# Patient Record
Sex: Female | Born: 1955 | Race: White | Hispanic: No | Marital: Single | Smoking: Current every day smoker
Health system: Southern US, Community
[De-identification: ages and names within clinical notes are randomized; demographics above are authoritative.]

## PROBLEM LIST (undated history)

## (undated) DIAGNOSIS — M549 Dorsalgia, unspecified: Secondary | ICD-10-CM

## (undated) DIAGNOSIS — E162 Hypoglycemia, unspecified: Secondary | ICD-10-CM

## (undated) DIAGNOSIS — R51 Headache: Secondary | ICD-10-CM

## (undated) DIAGNOSIS — M797 Fibromyalgia: Secondary | ICD-10-CM

## (undated) DIAGNOSIS — E7800 Pure hypercholesterolemia, unspecified: Secondary | ICD-10-CM

## (undated) DIAGNOSIS — H269 Unspecified cataract: Secondary | ICD-10-CM

## (undated) DIAGNOSIS — G8929 Other chronic pain: Secondary | ICD-10-CM

## (undated) HISTORY — DX: Unspecified cataract: H26.9

## (undated) HISTORY — PX: ADENOIDECTOMY: SUR15

## (undated) HISTORY — PX: FOOT SURGERY: SHX648

## (undated) HISTORY — PX: TONSILLECTOMY: SUR1361

---

## 2011-08-14 ENCOUNTER — Emergency Department (HOSPITAL_BASED_OUTPATIENT_CLINIC_OR_DEPARTMENT_OTHER)
Admission: EM | Admit: 2011-08-14 | Discharge: 2011-08-15 | Disposition: A | Discharge status: Home / Self Care | Payer: Managed Care, Other (non HMO) | Attending: Emergency Medicine | Admitting: Emergency Medicine

## 2011-08-14 ENCOUNTER — Encounter: Payer: Self-pay | Admitting: *Deleted

## 2011-08-14 DIAGNOSIS — R110 Nausea: Secondary | ICD-10-CM | POA: Insufficient documentation

## 2011-08-14 DIAGNOSIS — E7800 Pure hypercholesterolemia, unspecified: Secondary | ICD-10-CM | POA: Insufficient documentation

## 2011-08-14 DIAGNOSIS — Z79899 Other long term (current) drug therapy: Secondary | ICD-10-CM | POA: Insufficient documentation

## 2011-08-14 DIAGNOSIS — R197 Diarrhea, unspecified: Secondary | ICD-10-CM | POA: Insufficient documentation

## 2011-08-14 DIAGNOSIS — IMO0001 Reserved for inherently not codable concepts without codable children: Secondary | ICD-10-CM | POA: Insufficient documentation

## 2011-08-14 DIAGNOSIS — R51 Headache: Secondary | ICD-10-CM | POA: Insufficient documentation

## 2011-08-14 HISTORY — DX: Hypoglycemia, unspecified: E16.2

## 2011-08-14 HISTORY — DX: Pure hypercholesterolemia, unspecified: E78.00

## 2011-08-14 HISTORY — DX: Headache: R51

## 2011-08-14 HISTORY — DX: Fibromyalgia: M79.7

## 2011-08-14 MED ORDER — KETOROLAC TROMETHAMINE 30 MG/ML IJ SOLN
30.0000 mg | Freq: Once | INTRAMUSCULAR | Status: AC
  Administered 2011-08-14: 30 mg via INTRAVENOUS
  Filled 2011-08-14: qty 1

## 2011-08-14 MED ORDER — PROMETHAZINE HCL 25 MG/ML IJ SOLN
25.0000 mg | Freq: Once | INTRAMUSCULAR | Status: AC
  Administered 2011-08-14: 25 mg via INTRAVENOUS
  Filled 2011-08-14: qty 1

## 2011-08-14 MED ORDER — SODIUM CHLORIDE 0.9 % IV BOLUS (SEPSIS)
500.0000 mL | Freq: Once | INTRAVENOUS | Status: AC
  Administered 2011-08-14: 1000 mL via INTRAVENOUS

## 2011-08-14 NOTE — ED Notes (Signed)
Pt to room 6 by ems via stretcher. Pt reports her usual migraine sx onset 3pm today, with photophobia, n/v. No relief with her home meds, pt states she is also having "a flare up" of her fibromyalgia, so she had to take "a few of my pain pills today". Pt states her pain is 10/10, then drifts off to sleep while this rn is triaging her. Husband is at bedside.

## 2011-08-14 NOTE — ED Provider Notes (Signed)
History     CSN: [id number]  Arrival date & time 08/14/11  Debbie Mclaughlin   First MD Initiated Contact with Patient 08/14/11 2150      Chief Complaint  Patient presents with  . Headache    (Consider location/radiation/quality/duration/timing/severity/associated sxs/prior treatment) Patient is a 55 y.o. female presenting with headaches. The history is provided by the patient.  Headache  This is a recurrent problem. The current episode started yesterday. The problem has not changed since onset.Associated symptoms include nausea. Pertinent negatives include no fever and no vomiting.   patient states she has a headache that began today. States she woke with the symptoms. She has a history of migraines and fibromyalgia. She states normally the pain will go way with muscle relaxers and pain medicines. She states this time has not. She states she's had nauseousness without vomiting. No fevers. She states her fibromyalgia is acting up too. She states she's been fatigued also. She states this is the most severe headache she has had, but it is the same type headache she normally has. She states that she's been drinking a lot of water for the last few days to flush out her system. She states she's been more fatigued than normal.  Past Medical History  Diagnosis Date  . Headache   . Hypoglycemia   . Fibromyalgia   . High cholesterol     History reviewed. No pertinent past surgical history.  No family history on file.  History  Substance Use Topics  . Smoking status: Not on file  . Smokeless tobacco: Not on file  . Alcohol Use: Yes     occasionally    OB History    Grav Para Term Preterm Abortions TAB SAB Ect Mult Living                  Review of Systems  Constitutional: Positive for fatigue. Negative for fever and chills.  HENT: Negative for ear pain.   Eyes: Positive for photophobia. Negative for pain.  Respiratory: Negative for cough.   Gastrointestinal: Positive for nausea and  diarrhea. Negative for vomiting.  Genitourinary: Negative for flank pain.  Skin: Negative for pallor and rash.  Neurological: Positive for headaches. Negative for seizures.    Allergies  Benzoyl peroxide  Home Medications   Current Outpatient Rx  Name Route Sig Dispense Refill  . CALCIUM 1200 PO Oral Take 1 tablet by mouth daily.      Marland Kitchen VITAMIN D 1000 UNITS PO TABS Oral Take 1,000 Units by mouth daily.      . DULOXETINE HCL 60 MG PO CPEP Oral Take 60 mg by mouth daily.      Marland Kitchen HYDROCODONE-ACETAMINOPHEN 7.5-325 MG PO TABS Oral Take 1-2 tablets by mouth every 6 (six) hours as needed. For pain      . MAGNESIUM PO Oral Take 1 tablet by mouth daily.      Marland Kitchen MINOCYCLINE HCL 50 MG PO CAPS Oral Take 50 mg by mouth every 12 (twelve) hours.      Marland Kitchen PITAVASTATIN CALCIUM 2 MG PO TABS Oral Take 1 tablet by mouth daily.      Marland Kitchen TIZANIDINE HCL 4 MG PO TABS Oral Take 2-4 mg by mouth 3 (three) times daily as needed. For muscle spasms        BP 125/72  Pulse 56  Temp(Src) 97.4 F (36.3 C) (Oral)  Resp 18  Ht 5\' 7"  (1.702 m)  Wt 245 lb (111.131 kg)  BMI 38.37 kg/m2  SpO2  100%  Physical Exam  Nursing note and vitals reviewed. Constitutional: She is oriented to person, place, and time. She appears well-developed and well-nourished.  HENT:  Head: Normocephalic and atraumatic.  Eyes: EOM are normal. Pupils are equal, round, and reactive to light.  Neck: Normal range of motion. Neck supple.  Cardiovascular: Regular rhythm and normal heart sounds.   No murmur heard.      Mild bradycardia  Pulmonary/Chest: Effort normal and breath sounds normal. No respiratory distress. She has no wheezes. She has no rales.  Abdominal: Soft. Bowel sounds are normal. She exhibits no distension. There is no tenderness. There is no rebound and no guarding.  Musculoskeletal: Normal range of motion.  Lymphadenopathy:    She has no cervical adenopathy.  Neurological: She is alert and oriented to person, place, and  time. No cranial nerve deficit.  Skin: Skin is warm and dry.  Psychiatric: She has a normal mood and affect. Her speech is normal.    ED Course  Procedures (including critical care time)  Labs Reviewed  BASIC METABOLIC PANEL - Abnormal; Notable for the following:    Glucose, Bld 102 (*)    GFR calc non Af Amer 63 (*)    GFR calc Af Amer 73 (*)    All other components within normal limits   No results found.   1. Headache       MDM  Patient has headache. Also some nausea vomiting diarrhea. Pain is like her typical headaches, but more severe. No fevers. Patient feels better after treatment with a migraine cocktail. She'll be discharged home with her husband.  Juliet Rude. Rubin Payor, MD 08/15/11 [id number]

## 2011-08-15 LAB — BASIC METABOLIC PANEL
Calcium: 10.1 mg/dL (ref 8.4–10.5)
Creatinine, Ser: 1 mg/dL (ref 0.50–1.10)
GFR calc Af Amer: 73 mL/min — ABNORMAL LOW (ref >90)
Sodium: 140 mEq/L (ref 135–145)

## 2013-01-17 ENCOUNTER — Emergency Department (HOSPITAL_BASED_OUTPATIENT_CLINIC_OR_DEPARTMENT_OTHER)
Admission: EM | Admit: 2013-01-17 | Discharge: 2013-01-17 | Disposition: A | Discharge status: Home / Self Care | Payer: BC Managed Care – PPO | Attending: Emergency Medicine | Admitting: Emergency Medicine

## 2013-01-17 ENCOUNTER — Encounter (HOSPITAL_BASED_OUTPATIENT_CLINIC_OR_DEPARTMENT_OTHER): Payer: Self-pay

## 2013-01-17 DIAGNOSIS — G43909 Migraine, unspecified, not intractable, without status migrainosus: Secondary | ICD-10-CM | POA: Insufficient documentation

## 2013-01-17 DIAGNOSIS — M549 Dorsalgia, unspecified: Secondary | ICD-10-CM | POA: Insufficient documentation

## 2013-01-17 DIAGNOSIS — Z862 Personal history of diseases of the blood and blood-forming organs and certain disorders involving the immune mechanism: Secondary | ICD-10-CM | POA: Insufficient documentation

## 2013-01-17 DIAGNOSIS — Z8639 Personal history of other endocrine, nutritional and metabolic disease: Secondary | ICD-10-CM | POA: Insufficient documentation

## 2013-01-17 DIAGNOSIS — IMO0001 Reserved for inherently not codable concepts without codable children: Secondary | ICD-10-CM | POA: Insufficient documentation

## 2013-01-17 DIAGNOSIS — G8929 Other chronic pain: Secondary | ICD-10-CM | POA: Insufficient documentation

## 2013-01-17 DIAGNOSIS — R110 Nausea: Secondary | ICD-10-CM | POA: Insufficient documentation

## 2013-01-17 DIAGNOSIS — H53149 Visual discomfort, unspecified: Secondary | ICD-10-CM | POA: Insufficient documentation

## 2013-01-17 DIAGNOSIS — R5381 Other malaise: Secondary | ICD-10-CM | POA: Insufficient documentation

## 2013-01-17 DIAGNOSIS — F17200 Nicotine dependence, unspecified, uncomplicated: Secondary | ICD-10-CM | POA: Insufficient documentation

## 2013-01-17 HISTORY — DX: Dorsalgia, unspecified: M54.9

## 2013-01-17 HISTORY — DX: Other chronic pain: G89.29

## 2013-01-17 MED ORDER — KETOROLAC TROMETHAMINE 30 MG/ML IJ SOLN
30.0000 mg | Freq: Once | INTRAMUSCULAR | Status: AC
  Administered 2013-01-17: 30 mg via INTRAVENOUS
  Filled 2013-01-17: qty 1

## 2013-01-17 MED ORDER — METOCLOPRAMIDE HCL 5 MG/ML IJ SOLN
10.0000 mg | Freq: Once | INTRAMUSCULAR | Status: AC
  Administered 2013-01-17: 10 mg via INTRAVENOUS
  Filled 2013-01-17: qty 2

## 2013-01-17 MED ORDER — SODIUM CHLORIDE 0.9 % IV BOLUS (SEPSIS)
1000.0000 mL | Freq: Once | INTRAVENOUS | Status: AC
  Administered 2013-01-17: 1000 mL via INTRAVENOUS

## 2013-01-17 NOTE — ED Provider Notes (Signed)
History     CSN: [id number]  Arrival date & time 01/17/13  5188   First MD Initiated Contact with Patient 01/17/13 [phone]      Chief Complaint  Patient presents with  . Migraine  . Back Pain    (Consider location/radiation/quality/duration/timing/severity/associated sxs/prior treatment) Patient is a 57 y.o. female presenting with migraines and back pain.  Migraine  Back Pain  Pt with history of migraines and fibromyalgia reports she had diffuse throbbing frontal headache onset during the night, associated with nausea, photophobia and generalized weakness as well as back pain. She took an excedrin migraine this AM but apparently began having cold sweats and so a friend called EMS. She actually is feeling better from a headache stand point now. Pain was 10/10 but now 3/10, however she still has photophobia and general weakness. This is similar to previous headaches.   Past Medical History  Diagnosis Date  . Headache(784.0)   . Hypoglycemia   . Fibromyalgia   . High cholesterol   . Chronic back pain     Past Surgical History  Procedure Laterality Date  . Cesarean section    . Foot surgery    . Tonsillectomy    . Adenoidectomy      No family history on file.  History  Substance Use Topics  . Smoking status: Current Every Day Smoker -- 1.00 packs/day    Types: Cigarettes  . Smokeless tobacco: Not on file  . Alcohol Use: Yes     Comment: rarely    OB History   Grav Para Term Preterm Abortions TAB SAB Ect Mult Living                  Review of Systems  Musculoskeletal: Positive for back pain.   All other systems reviewed and are negative except as noted in HPI.   Allergies  Benzoyl peroxide  Home Medications   Current Outpatient Rx  Name  Route  Sig  Dispense  Refill  . traMADol (ULTRAM) 50 MG tablet   Oral   Take 50 mg by mouth every 6 (six) hours as needed for pain.           BP 120/76  Pulse 57  Temp(Src) 97.5 F (36.4 C) (Oral)  Resp 18  Ht  5\' 6"  (1.676 m)  Wt 229 lb (103.874 kg)  BMI 36.98 kg/m2  SpO2 97%  Physical Exam  Nursing note and vitals reviewed. Constitutional: She is oriented to person, place, and time. She appears well-developed and well-nourished.  HENT:  Head: Normocephalic and atraumatic.  Eyes: EOM are normal. Pupils are equal, round, and reactive to light.  Neck: Normal range of motion. Neck supple.  Cardiovascular: Normal rate, normal heart sounds and intact distal pulses.   Pulmonary/Chest: Effort normal and breath sounds normal.  Abdominal: Bowel sounds are normal. She exhibits no distension. There is no tenderness.  Musculoskeletal: Normal range of motion. She exhibits no edema and no tenderness.  Neurological: She is alert and oriented to person, place, and time. She has normal strength. No cranial nerve deficit or sensory deficit. Coordination normal.  Skin: Skin is warm and dry. No rash noted.  Psychiatric: She has a normal mood and affect.    ED Course  Procedures (including critical care time)  Labs Reviewed - No data to display No results found.   1. Migraine       MDM  Pt feeling much better after Toradol/Reglan and IVF. She is sitting up smiling and  ready to go home.         Blong Busk B. Bernette Mayers, MD 01/17/13 [phone]

## 2013-01-17 NOTE — ED Notes (Signed)
Pt reports onset of migraine this am and increased back pain unrelieved after taking Excedrin Migraine.

## 2014-11-22 ENCOUNTER — Encounter (HOSPITAL_BASED_OUTPATIENT_CLINIC_OR_DEPARTMENT_OTHER): Payer: Self-pay | Admitting: Emergency Medicine

## 2014-11-22 ENCOUNTER — Emergency Department (HOSPITAL_BASED_OUTPATIENT_CLINIC_OR_DEPARTMENT_OTHER)
Admission: EM | Admit: 2014-11-22 | Discharge: 2014-11-22 | Disposition: A | Discharge status: Home / Self Care | Payer: Medicare Other | Attending: Emergency Medicine | Admitting: Emergency Medicine

## 2014-11-22 DIAGNOSIS — R51 Headache: Secondary | ICD-10-CM | POA: Insufficient documentation

## 2014-11-22 DIAGNOSIS — G8929 Other chronic pain: Secondary | ICD-10-CM | POA: Diagnosis not present

## 2014-11-22 DIAGNOSIS — R519 Headache, unspecified: Secondary | ICD-10-CM

## 2014-11-22 DIAGNOSIS — Z720 Tobacco use: Secondary | ICD-10-CM | POA: Diagnosis not present

## 2014-11-22 DIAGNOSIS — Z79899 Other long term (current) drug therapy: Secondary | ICD-10-CM | POA: Diagnosis not present

## 2014-11-22 DIAGNOSIS — Z8639 Personal history of other endocrine, nutritional and metabolic disease: Secondary | ICD-10-CM | POA: Diagnosis not present

## 2014-11-22 DIAGNOSIS — Z91040 Latex allergy status: Secondary | ICD-10-CM | POA: Diagnosis not present

## 2014-11-22 DIAGNOSIS — Z8739 Personal history of other diseases of the musculoskeletal system and connective tissue: Secondary | ICD-10-CM | POA: Insufficient documentation

## 2014-11-22 LAB — URINALYSIS, ROUTINE W REFLEX MICROSCOPIC
BILIRUBIN URINE: NEGATIVE
Glucose, UA: NEGATIVE mg/dL
Ketones, ur: NEGATIVE mg/dL
Leukocytes, UA: NEGATIVE
Nitrite: NEGATIVE
Protein, ur: NEGATIVE mg/dL
SPECIFIC GRAVITY, URINE: 1.02 (ref 1.005–1.030)
UROBILINOGEN UA: 0.2 mg/dL (ref 0.0–1.0)
pH: 6.5 (ref 5.0–8.0)

## 2014-11-22 LAB — URINE MICROSCOPIC-ADD ON

## 2014-11-22 LAB — COMPREHENSIVE METABOLIC PANEL
ALBUMIN: 3.8 g/dL (ref 3.5–5.2)
ALT: 16 U/L (ref 0–35)
AST: 18 U/L (ref 0–37)
Alkaline Phosphatase: 86 U/L (ref 39–117)
Anion gap: 6 (ref 5–15)
BUN: 19 mg/dL (ref 6–23)
CHLORIDE: 105 mmol/L (ref 96–112)
CO2: 27 mmol/L (ref 19–32)
Calcium: 9.5 mg/dL (ref 8.4–10.5)
Creatinine, Ser: 1.11 mg/dL — ABNORMAL HIGH (ref 0.50–1.10)
GFR, EST AFRICAN AMERICAN: 63 mL/min — AB (ref >90)
GFR, EST NON AFRICAN AMERICAN: 54 mL/min — AB (ref >90)
GLUCOSE: 105 mg/dL — AB (ref 70–99)
POTASSIUM: 4.1 mmol/L (ref 3.5–5.1)
Sodium: 138 mmol/L (ref 135–145)
Total Bilirubin: 0.1 mg/dL — ABNORMAL LOW (ref 0.3–1.2)
Total Protein: 7.1 g/dL (ref 6.0–8.3)

## 2014-11-22 LAB — CBC WITH DIFFERENTIAL/PLATELET
BASOS PCT: 0 % (ref 0–1)
Basophils Absolute: 0 10*3/uL (ref 0.0–0.1)
EOS ABS: 0.1 10*3/uL (ref 0.0–0.7)
Eosinophils Relative: 1 % (ref 0–5)
HEMATOCRIT: 42 % (ref 36.0–46.0)
HEMOGLOBIN: 14.3 g/dL (ref 12.0–15.0)
Lymphocytes Relative: 18 % (ref 12–46)
Lymphs Abs: 1.3 10*3/uL (ref 0.7–4.0)
MCH: 30.6 pg (ref 26.0–34.0)
MCHC: 34 g/dL (ref 30.0–36.0)
MCV: 89.9 fL (ref 78.0–100.0)
MONO ABS: 0.4 10*3/uL (ref 0.1–1.0)
MONOS PCT: 5 % (ref 3–12)
Neutro Abs: 5.5 10*3/uL (ref 1.7–7.7)
Neutrophils Relative %: 76 % (ref 43–77)
Platelets: 216 10*3/uL (ref 150–400)
RBC: 4.67 MIL/uL (ref 3.87–5.11)
RDW: 13.7 % (ref 11.5–15.5)
WBC: 7.2 10*3/uL (ref 4.0–10.5)

## 2014-11-22 LAB — LIPASE, BLOOD: Lipase: 25 U/L (ref 11–59)

## 2014-11-22 MED ORDER — SODIUM CHLORIDE 0.9 % IV BOLUS (SEPSIS)
1000.0000 mL | Freq: Once | INTRAVENOUS | Status: AC
  Administered 2014-11-22: 1000 mL via INTRAVENOUS

## 2014-11-22 MED ORDER — METOCLOPRAMIDE HCL 5 MG/ML IJ SOLN
10.0000 mg | Freq: Once | INTRAMUSCULAR | Status: AC
  Administered 2014-11-22: 10 mg via INTRAVENOUS
  Filled 2014-11-22: qty 2

## 2014-11-22 MED ORDER — DIPHENHYDRAMINE HCL 50 MG/ML IJ SOLN
25.0000 mg | Freq: Once | INTRAMUSCULAR | Status: AC
  Administered 2014-11-22: 25 mg via INTRAVENOUS
  Filled 2014-11-22: qty 1

## 2014-11-22 MED ORDER — KETOROLAC TROMETHAMINE 30 MG/ML IJ SOLN
30.0000 mg | Freq: Once | INTRAMUSCULAR | Status: AC
  Administered 2014-11-22: 30 mg via INTRAVENOUS
  Filled 2014-11-22: qty 1

## 2014-11-22 NOTE — ED Notes (Signed)
HA and nausea since this morning. Hx of migraines.  Diarrhea  x2 weeks.

## 2014-11-22 NOTE — Discharge Instructions (Signed)

## 2014-11-22 NOTE — ED Provider Notes (Signed)
CSN: [id number]     Arrival date & time 11/22/14  1800 History  This chart was scribed for Glynn Octave, MD by Annye Asa, ED Scribe. This patient was seen in room MH07/MH07 and the patient's care was started at 8:56 PM.    Chief Complaint  Patient presents with  . Migraine   Patient is a 59 y.o. female presenting with migraines. The history is provided by the patient. No language interpreter was used.  Migraine  HPI Comments: Debbie Mclaughlin is a 59 y.o. female with past medical history of headache, hypoglycemia, fibromyalgia, HLD, chronic back pain who presents to the Emergency Department complaining of gradually worsening generalized headache and nausea, beginning this morning upon waking, with associated photo- and phonophobias. Her pain increases with extraocular movement. She denies vision disturbances, fevers, vomiting. She states she took  oxycodone today without relief. Patient reports that her current symptoms feel similar to prior experience with migraine headaches.   She notes 10 days of diarrhea (approximately 10x per day) and intermittent abdominal pain, exacerbated with food intake. She explains that she has been "detoxing" in an attempt to control her symptoms; "drinking significant amounts of water, lemon juice." She denies recent travel, sick contacts.   Prior abdominal surgical history includes two c-sections; internal anatomy intact, still has gallbladder, appendix, reproductive organs.   Past Medical History  Diagnosis Date  . Headache(784.0)   . Hypoglycemia   . Fibromyalgia   . High cholesterol   . Chronic back pain    Past Surgical History  Procedure Laterality Date  . Cesarean section    . Foot surgery    . Tonsillectomy    . Adenoidectomy     No family history on file. History  Substance Use Topics  . Smoking status: Current Every Day Smoker -- 1.00 packs/day    Types: Cigarettes  . Smokeless tobacco: Not on file  . Alcohol Use: Yes     Comment:  rarely   OB History    No data available     Review of Systems  A complete 10 system review of systems was obtained and all systems are negative except as noted in the HPI and PMH.   Allergies  Benzoyl peroxide; Latex; and Sulfa antibiotics  Home Medications   Prior to Admission medications   Medication Sig Start Date End Date Taking? Authorizing Provider  Multiple Vitamin (MULTIVITAMIN) capsule Take 1 capsule by mouth daily.   Yes Historical Provider, MD  oxycodone (OXY-IR) 5 MG capsule Take 5 mg by mouth every 4 (four) hours as needed.   Yes Historical Provider, MD  traMADol (ULTRAM) 50 MG tablet Take 50 mg by mouth every 6 (six) hours as needed for pain.    Historical Provider, MD   BP 111/88 mmHg  Pulse 60  Temp(Src) 97.9 F (36.6 C) (Oral)  Resp 18  Ht 5' 6.5" (1.689 m)  Wt 228 lb (103.42 kg)  BMI 36.25 kg/m2  SpO2 100% Physical Exam  Constitutional: She is oriented to person, place, and time. She appears well-developed and well-nourished. No distress.  HENT:  Head: Normocephalic and atraumatic.  Mouth/Throat: Oropharynx is clear and moist. No oropharyngeal exudate.  Eyes: Conjunctivae and EOM are normal. Pupils are equal, round, and reactive to light.  Photophobia  Neck: Normal range of motion. Neck supple.  No meningismus.  Cardiovascular: Normal rate, regular rhythm, normal heart sounds and intact distal pulses.   No murmur heard. Pulmonary/Chest: Effort normal and breath sounds normal. No respiratory distress.  Abdominal: Soft. There is no tenderness. There is no rebound and no guarding.  Musculoskeletal: Normal range of motion. She exhibits no edema or tenderness.  Neurological: She is alert and oriented to person, place, and time. No cranial nerve deficit. She exhibits normal muscle tone. Coordination normal.  No ataxia on finger to nose bilaterally. No pronator drift. 5/5 strength throughout. CN 2-12 intact. Negative Romberg. Equal grip strength. Sensation  intact. No nystagmus.  Skin: Skin is warm.  Psychiatric: She has a normal mood and affect. Her behavior is normal.  Nursing note and vitals reviewed.   ED Course  Procedures   DIAGNOSTIC STUDIES: Oxygen Saturation is 97% on RA, adequate by my interpretation.    COORDINATION OF CARE: 9:03 PM Discussed treatment plan with pt at bedside and pt agreed to plan.   Labs Review Labs Reviewed  COMPREHENSIVE METABOLIC PANEL - Abnormal; Notable for the following:    Glucose, Bld 105 (*)    Creatinine, Ser 1.11 (*)    Total Bilirubin 0.1 (*)    GFR calc non Af Amer 54 (*)    GFR calc Af Amer 63 (*)    All other components within normal limits  URINALYSIS, ROUTINE W REFLEX MICROSCOPIC - Abnormal; Notable for the following:    Hgb urine dipstick MODERATE (*)    All other components within normal limits  CBC WITH DIFFERENTIAL/PLATELET  LIPASE, BLOOD  URINE MICROSCOPIC-ADD ON    Imaging Review No results found.   EKG Interpretation None      MDM   Final diagnoses:  Headache, unspecified headache type   Gradual onset headache since this morning associated with nausea. Similar to previous migraines. No fever. No visual change, no focal weakness, numbness or tingling.  Intermittent diarrhea and abdominal pain for the past 2 weeks though none currently. No fever or vomiting.  Neurological exam is nonfocal. Headache is gradual in onset. Doubt subretinal hemorrhage or meningitis. No indication for CT imaging. Abdomen is benign.  Headache has resolved with treatment in the ED. No vomiting. Patient is feeling better and requesting discharge. Return precautions discussed.  I personally performed the services described in this documentation, which was scribed in my presence. The recorded information has been reviewed and is accurate.     Glynn OctaveStephen Kyrillos Adams, MD 11/23/14 [phone]

## 2015-11-12 ENCOUNTER — Emergency Department (HOSPITAL_BASED_OUTPATIENT_CLINIC_OR_DEPARTMENT_OTHER)
Admission: EM | Admit: 2015-11-12 | Discharge: 2015-11-12 | Disposition: A | Discharge status: Home / Self Care | Payer: Medicare Other | Attending: Emergency Medicine | Admitting: Emergency Medicine

## 2015-11-12 ENCOUNTER — Emergency Department (HOSPITAL_BASED_OUTPATIENT_CLINIC_OR_DEPARTMENT_OTHER): Payer: Medicare Other

## 2015-11-12 ENCOUNTER — Encounter (HOSPITAL_BASED_OUTPATIENT_CLINIC_OR_DEPARTMENT_OTHER): Payer: Self-pay | Admitting: *Deleted

## 2015-11-12 DIAGNOSIS — F17210 Nicotine dependence, cigarettes, uncomplicated: Secondary | ICD-10-CM | POA: Diagnosis not present

## 2015-11-12 DIAGNOSIS — N23 Unspecified renal colic: Secondary | ICD-10-CM | POA: Diagnosis not present

## 2015-11-12 DIAGNOSIS — Z8639 Personal history of other endocrine, nutritional and metabolic disease: Secondary | ICD-10-CM | POA: Diagnosis not present

## 2015-11-12 DIAGNOSIS — Z91040 Latex allergy status: Secondary | ICD-10-CM | POA: Diagnosis not present

## 2015-11-12 DIAGNOSIS — M797 Fibromyalgia: Secondary | ICD-10-CM | POA: Insufficient documentation

## 2015-11-12 DIAGNOSIS — G8929 Other chronic pain: Secondary | ICD-10-CM | POA: Diagnosis not present

## 2015-11-12 DIAGNOSIS — Z79899 Other long term (current) drug therapy: Secondary | ICD-10-CM | POA: Insufficient documentation

## 2015-11-12 DIAGNOSIS — M549 Dorsalgia, unspecified: Secondary | ICD-10-CM | POA: Diagnosis present

## 2015-11-12 LAB — COMPREHENSIVE METABOLIC PANEL
ALT: 14 U/L (ref 14–54)
AST: 16 U/L (ref 15–41)
Albumin: 3.9 g/dL (ref 3.5–5.0)
Alkaline Phosphatase: 87 U/L (ref 38–126)
Anion gap: 9 (ref 5–15)
BUN: 18 mg/dL (ref 6–20)
CHLORIDE: 105 mmol/L (ref 101–111)
CO2: 24 mmol/L (ref 22–32)
CREATININE: 0.88 mg/dL (ref 0.44–1.00)
Calcium: 9.2 mg/dL (ref 8.9–10.3)
GFR calc Af Amer: >60 mL/min (ref >60)
GFR calc non Af Amer: >60 mL/min (ref >60)
Glucose, Bld: 95 mg/dL (ref 65–99)
Potassium: 4.2 mmol/L (ref 3.5–5.1)
SODIUM: 138 mmol/L (ref 135–145)
Total Bilirubin: 0.6 mg/dL (ref 0.3–1.2)
Total Protein: 7.2 g/dL (ref 6.5–8.1)

## 2015-11-12 LAB — CBC WITH DIFFERENTIAL/PLATELET
BASOS ABS: 0 10*3/uL (ref 0.0–0.1)
Basophils Relative: 0 %
EOS PCT: 1 %
Eosinophils Absolute: 0.1 10*3/uL (ref 0.0–0.7)
HCT: 41 % (ref 36.0–46.0)
Hemoglobin: 14 g/dL (ref 12.0–15.0)
LYMPHS PCT: 11 %
Lymphs Abs: 1.2 10*3/uL (ref 0.7–4.0)
MCH: 31.3 pg (ref 26.0–34.0)
MCHC: 34.1 g/dL (ref 30.0–36.0)
MCV: 91.7 fL (ref 78.0–100.0)
Monocytes Absolute: 0.6 10*3/uL (ref 0.1–1.0)
Monocytes Relative: 6 %
Neutro Abs: 8.8 10*3/uL — ABNORMAL HIGH (ref 1.7–7.7)
Neutrophils Relative %: 82 %
PLATELETS: 217 10*3/uL (ref 150–400)
RBC: 4.47 MIL/uL (ref 3.87–5.11)
RDW: 13.3 % (ref 11.5–15.5)
WBC: 10.6 10*3/uL — AB (ref 4.0–10.5)

## 2015-11-12 LAB — URINE MICROSCOPIC-ADD ON

## 2015-11-12 LAB — URINALYSIS, ROUTINE W REFLEX MICROSCOPIC
BILIRUBIN URINE: NEGATIVE
Glucose, UA: NEGATIVE mg/dL
Ketones, ur: NEGATIVE mg/dL
Leukocytes, UA: NEGATIVE
Nitrite: NEGATIVE
PH: 5.5 (ref 5.0–8.0)
Protein, ur: 30 mg/dL — AB
SPECIFIC GRAVITY, URINE: 1.022 (ref 1.005–1.030)

## 2015-11-12 MED ORDER — OXYCODONE-ACETAMINOPHEN 5-325 MG PO TABS
ORAL_TABLET | ORAL | Status: AC
  Filled 2015-11-12: qty 1

## 2015-11-12 MED ORDER — SODIUM CHLORIDE 0.9 % IV BOLUS (SEPSIS)
1000.0000 mL | Freq: Once | INTRAVENOUS | Status: AC
  Administered 2015-11-12: 1000 mL via INTRAVENOUS

## 2015-11-12 MED ORDER — OXYCODONE-ACETAMINOPHEN 5-325 MG PO TABS: 1.0000 | ORAL_TABLET | Freq: Three times a day (TID) | ORAL | Status: DC | PRN

## 2015-11-12 NOTE — ED Provider Notes (Signed)
CSN: [id number]     Arrival date & time 11/12/15  1539 History   First MD Initiated Contact with Patient 11/12/15 1642     Chief Complaint  Patient presents with  . Back Pain     (Consider location/radiation/quality/duration/timing/severity/associated sxs/prior Treatment) HPI Comments: [facility] today for evaluation of sudden onset of left flank pain radiating to LLQ.  Urinalysis completed there revealed significant blood in urine.  Patient received medication for pain, and pain is controlled at present.  No personal history of kidney stones. Prior history of diverticulitis without any flares for 20 years.   Patient is a 60 y.o. female presenting with back pain and flank pain. The history is provided by the patient. No language interpreter was used.  Back Pain Associated symptoms: abdominal pain   Flank Pain This is a new problem. The current episode started today. The problem has been gradually improving. Associated symptoms include abdominal pain. Pertinent negatives include no vomiting.    Past Medical History  Diagnosis Date  . Headache(784.0)   . Hypoglycemia   . Fibromyalgia   . High cholesterol   . Chronic back pain    Past Surgical History  Procedure Laterality Date  . Cesarean section    . Foot surgery    . Tonsillectomy    . Adenoidectomy     No family history on file. Social History  Substance Use Topics  . Smoking status: Current Every Day Smoker -- 1.00 packs/day    Types: Cigarettes  . Smokeless tobacco: None  . Alcohol Use: Yes     Comment: rarely   OB History    No data available     Review of Systems  Gastrointestinal: Positive for abdominal pain. Negative for vomiting.  Genitourinary: Positive for flank pain.  Musculoskeletal: Positive for back pain.  All other systems reviewed and are negative.     Allergies  Benzoyl peroxide; Latex; and Sulfa antibiotics  Home Medications   Prior to Admission medications    Medication Sig Start Date End Date Taking? Authorizing Provider  Multiple Vitamin (MULTIVITAMIN) capsule Take 1 capsule by mouth daily.    Historical Provider, MD  oxycodone (OXY-IR) 5 MG capsule Take 5 mg by mouth every 4 (four) hours as needed.    Historical Provider, MD  traMADol (ULTRAM) 50 MG tablet Take 50 mg by mouth every 6 (six) hours as needed for pain.    Historical Provider, MD   BP 113/85 mmHg  Pulse 73  Temp(Src) 97.8 F (36.6 C) (Oral)  Resp 16  Ht 5\' 6"  (1.676 m)  Wt 107.956 kg  BMI 38.43 kg/m2  SpO2 99% Physical Exam  Constitutional: She is oriented to person, place, and time. She appears well-developed and well-nourished.  HENT:  Head: Normocephalic.  Eyes: Conjunctivae are normal.  Neck: Neck supple.  Cardiovascular: Normal rate and regular rhythm.   Pulmonary/Chest: Effort normal and breath sounds normal.  Abdominal: Soft. Bowel sounds are normal. There is no CVA tenderness.    Musculoskeletal: She exhibits no edema or tenderness.  Neurological: She is alert and oriented to person, place, and time.  Skin: Skin is warm and dry.  Psychiatric: She has a normal mood and affect.  Nursing note and vitals reviewed.   ED Course  Procedures (including critical care time) Labs Review Labs Reviewed  CBC WITH DIFFERENTIAL/PLATELET - Abnormal; Notable for the following:    WBC 10.6 (*)    Neutro Abs 8.8 (*)  All other components within normal limits  COMPREHENSIVE METABOLIC PANEL  URINALYSIS, ROUTINE W REFLEX MICROSCOPIC (NOT AT [facility])    Imaging Review Ct Renal Stone Study  11/12/2015  CLINICAL DATA:  LEFT flank pain since this afternoon, hematuria, smoker EXAM: CT ABDOMEN AND PELVIS WITHOUT CONTRAST TECHNIQUE: Multidetector CT imaging of the abdomen and pelvis was performed following the standard protocol without IV contrast. Sagittal and coronal MPR images reconstructed from axial data set. Oral contrast not administered for this indication. COMPARISON:   None FINDINGS: Minimal bibasilar atelectasis. Mild LEFT hydronephrosis secondary to a 4 mm diameter proximal LEFT ureteral calculus image 47. Additional tiny nonobstructing calculus upper pole RIGHT kidney. Anterior cyst LEFT lobe liver 20 x 15 mm image 9. Liver, gallbladder, spleen, pancreas, kidneys, and adrenal glands otherwise normal. Normal appendix, bladder, remaining ureters, uterus, and adnexa. Stomach and bowel loops grossly normal appearance for technique. No mass, adenopathy, free air, free fluid, or inflammatory process. Bones unremarkable. IMPRESSION: Mild LEFT hydronephrosis secondary to a 4 mm proximal LEFT ureteral calculus. Additional tiny nonobstructing RIGHT renal calculus. Small hepatic cyst. Electronically Signed   By: Ulyses Southward M.D.   On: 11/12/2015 18:21   I have personally reviewed and evaluated these images and lab results as part of my medical decision-making.   EKG Interpretation None     Radiology results reviewed and shared with patient. UA results from urgent care reviewed. No suspicion of infected stone. MDM   Final diagnoses:  None  Renal colic. Care instructions provided. Return precautions discussed. Follow-up with urology.      Felicie Morn, NP 11/13/15 1610  Richardean Canal, MD 11/13/15 [phone]

## 2015-11-12 NOTE — ED Notes (Signed)
Left flank pain into her abdomen x 4 hours ago. [facility] and sent here.

## 2015-11-12 NOTE — Discharge Instructions (Signed)
Kidney Stones °Kidney stones (urolithiasis) are deposits that form inside your kidneys. The intense pain is caused by the stone moving through the urinary tract. When the stone moves, the ureter goes into spasm around the stone. The stone is usually passed in the urine.  °CAUSES  °· A disorder that makes certain neck glands produce too much parathyroid hormone (primary hyperparathyroidism). °· A buildup of uric acid crystals, similar to gout in your joints. °· Narrowing (stricture) of the ureter. °· A kidney obstruction present at birth (congenital obstruction). °· Previous surgery on the kidney or ureters. °· Numerous kidney infections. °SYMPTOMS  °· Feeling sick to your stomach (nauseous). °· Throwing up (vomiting). °· Blood in the urine (hematuria). °· Pain that usually spreads (radiates) to the groin. °· Frequency or urgency of urination. °DIAGNOSIS  °· Taking a history and physical exam. °· Blood or urine tests. °· CT scan. °· Occasionally, an examination of the inside of the urinary bladder (cystoscopy) is performed. °TREATMENT  °· Observation. °· Increasing your fluid intake. °· Extracorporeal shock wave lithotripsy--This is a noninvasive procedure that uses shock waves to break up kidney stones. °· Surgery may be needed if you have severe pain or persistent obstruction. There are various surgical procedures. Most of the procedures are performed with the use of small instruments. Only small incisions are needed to accommodate these instruments, so recovery time is minimized. °The size, location, and chemical composition are all important variables that will determine the proper choice of action for you. Talk to your health care provider to better understand your situation so that you will minimize the risk of injury to yourself and your kidney.  °HOME CARE INSTRUCTIONS  °· Drink enough water and fluids to keep your urine clear or pale yellow. This will help you to pass the stone or stone fragments. °· Strain  all urine through the provided strainer. Keep all particulate matter and stones for your health care provider to see. The stone causing the pain may be as small as a grain of salt. It is very important to use the strainer each and every time you pass your urine. The collection of your stone will allow your health care provider to analyze it and verify that a stone has actually passed. The stone analysis will often identify what you can do to reduce the incidence of recurrences. °· Only take over-the-counter or prescription medicines for pain, discomfort, or fever as directed by your health care provider. °· Keep all follow-up visits as told by your health care provider. This is important. °· Get follow-up X-rays if required. The absence of pain does not always mean that the stone has passed. It may have only stopped moving. If the urine remains completely obstructed, it can cause loss of kidney function or even complete destruction of the kidney. It is your responsibility to make sure X-rays and follow-ups are completed. Ultrasounds of the kidney can show blockages and the status of the kidney. Ultrasounds are not associated with any radiation and can be performed easily in a matter of minutes. °· Make changes to your daily diet as told by your health care provider. You may be told to: °¨ Limit the amount of salt that you eat. °¨ Eat 5 or more servings of fruits and vegetables each day. °¨ Limit the amount of meat, poultry, fish, and eggs that you eat. °· Collect a 24-hour urine sample as told by your health care provider. You may need to collect another urine sample every 6-12   months. °SEEK MEDICAL CARE IF: °· You experience pain that is progressive and unresponsive to any pain medicine you have been prescribed. °SEEK IMMEDIATE MEDICAL CARE IF:  °· Pain cannot be controlled with the prescribed medicine. °· You have a fever or shaking chills. °· The severity or intensity of pain increases over 18 hours and is not  relieved by pain medicine. °· You develop a new onset of abdominal pain. °· You feel faint or pass out. °· You are unable to urinate. °  °This information is not intended to replace advice given to you by your health care provider. Make sure you discuss any questions you have with your health care provider. °  °Document Released: 08/04/2005 Document Revised: 04/25/2015 Document Reviewed: 01/05/2013 °[employer]vier Interactive Patient Education ©2016 [employer] ° °
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
## 2018-09-09 ENCOUNTER — Ambulatory Visit (INDEPENDENT_AMBULATORY_CARE_PROVIDER_SITE_OTHER): Payer: Medicare Other | Admitting: Ophthalmology

## 2018-09-09 ENCOUNTER — Encounter (INDEPENDENT_AMBULATORY_CARE_PROVIDER_SITE_OTHER): Payer: Self-pay | Admitting: Ophthalmology

## 2018-09-09 DIAGNOSIS — H43811 Vitreous degeneration, right eye: Secondary | ICD-10-CM | POA: Diagnosis not present

## 2018-09-09 DIAGNOSIS — H31001 Unspecified chorioretinal scars, right eye: Secondary | ICD-10-CM | POA: Diagnosis not present

## 2018-09-09 DIAGNOSIS — H25813 Combined forms of age-related cataract, bilateral: Secondary | ICD-10-CM

## 2018-09-09 DIAGNOSIS — H3581 Retinal edema: Secondary | ICD-10-CM
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
## 2018-10-07 ENCOUNTER — Ambulatory Visit (INDEPENDENT_AMBULATORY_CARE_PROVIDER_SITE_OTHER): Payer: Medicare Other | Admitting: Ophthalmology

## 2018-10-07 ENCOUNTER — Encounter (INDEPENDENT_AMBULATORY_CARE_PROVIDER_SITE_OTHER): Payer: Self-pay | Admitting: Ophthalmology

## 2018-10-07 DIAGNOSIS — H3581 Retinal edema: Secondary | ICD-10-CM

## 2018-10-07 DIAGNOSIS — H25813 Combined forms of age-related cataract, bilateral: Secondary | ICD-10-CM

## 2018-10-07 DIAGNOSIS — H31001 Unspecified chorioretinal scars, right eye: Secondary | ICD-10-CM

## 2018-10-07 DIAGNOSIS — H43811 Vitreous degeneration, right eye: Secondary | ICD-10-CM | POA: Diagnosis not present

## 2018-10-08 ENCOUNTER — Encounter (INDEPENDENT_AMBULATORY_CARE_PROVIDER_SITE_OTHER): Payer: Self-pay | Admitting: Ophthalmology
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
## 2019-04-07 ENCOUNTER — Encounter (INDEPENDENT_AMBULATORY_CARE_PROVIDER_SITE_OTHER): Payer: Self-pay | Admitting: Ophthalmology

## 2019-04-07 ENCOUNTER — Other Ambulatory Visit: Payer: Self-pay

## 2019-04-07 ENCOUNTER — Ambulatory Visit (INDEPENDENT_AMBULATORY_CARE_PROVIDER_SITE_OTHER): Payer: Medicare Other | Admitting: Ophthalmology

## 2019-04-07 DIAGNOSIS — H25813 Combined forms of age-related cataract, bilateral: Secondary | ICD-10-CM | POA: Diagnosis not present

## 2019-04-07 DIAGNOSIS — H31001 Unspecified chorioretinal scars, right eye: Secondary | ICD-10-CM

## 2019-04-07 DIAGNOSIS — H43811 Vitreous degeneration, right eye: Secondary | ICD-10-CM | POA: Diagnosis not present

## 2019-04-07 DIAGNOSIS — H3581 Retinal edema: Secondary | ICD-10-CM

## 2019-10-07 ENCOUNTER — Encounter (INDEPENDENT_AMBULATORY_CARE_PROVIDER_SITE_OTHER): Payer: Medicare Other | Admitting: Ophthalmology

## 2020-01-24 ENCOUNTER — Emergency Department (HOSPITAL_BASED_OUTPATIENT_CLINIC_OR_DEPARTMENT_OTHER): Payer: Medicare Other

## 2020-01-24 ENCOUNTER — Encounter (HOSPITAL_BASED_OUTPATIENT_CLINIC_OR_DEPARTMENT_OTHER): Payer: Self-pay | Admitting: Emergency Medicine

## 2020-01-24 ENCOUNTER — Emergency Department (HOSPITAL_BASED_OUTPATIENT_CLINIC_OR_DEPARTMENT_OTHER)
Admission: EM | Admit: 2020-01-24 | Discharge: 2020-01-24 | Disposition: A | Discharge status: Home / Self Care | Payer: Medicare Other | Attending: Emergency Medicine | Admitting: Emergency Medicine

## 2020-01-24 ENCOUNTER — Other Ambulatory Visit: Payer: Self-pay

## 2020-01-24 DIAGNOSIS — Z91040 Latex allergy status: Secondary | ICD-10-CM | POA: Insufficient documentation

## 2020-01-24 DIAGNOSIS — R42 Dizziness and giddiness: Secondary | ICD-10-CM | POA: Insufficient documentation

## 2020-01-24 DIAGNOSIS — R202 Paresthesia of skin: Secondary | ICD-10-CM | POA: Diagnosis not present

## 2020-01-24 DIAGNOSIS — Z882 Allergy status to sulfonamides status: Secondary | ICD-10-CM | POA: Insufficient documentation

## 2020-01-24 DIAGNOSIS — Z883 Allergy status to other anti-infective agents status: Secondary | ICD-10-CM | POA: Diagnosis not present

## 2020-01-24 DIAGNOSIS — F17210 Nicotine dependence, cigarettes, uncomplicated: Secondary | ICD-10-CM | POA: Diagnosis not present

## 2020-01-24 LAB — COMPREHENSIVE METABOLIC PANEL
ALT: 17 U/L (ref 0–44)
AST: 19 U/L (ref 15–41)
Albumin: 4 g/dL (ref 3.5–5.0)
Alkaline Phosphatase: 63 U/L (ref 38–126)
Anion gap: 10 (ref 5–15)
BUN: 19 mg/dL (ref 8–23)
CO2: 24 mmol/L (ref 22–32)
Calcium: 9.2 mg/dL (ref 8.9–10.3)
Chloride: 105 mmol/L (ref 98–111)
Creatinine, Ser: 0.86 mg/dL (ref 0.44–1.00)
GFR calc Af Amer: >60 mL/min (ref >60)
GFR calc non Af Amer: >60 mL/min (ref >60)
Glucose, Bld: 96 mg/dL (ref 70–99)
Potassium: 3.5 mmol/L (ref 3.5–5.1)
Sodium: 139 mmol/L (ref 135–145)
Total Bilirubin: 1 mg/dL (ref 0.3–1.2)
Total Protein: 7.1 g/dL (ref 6.5–8.1)

## 2020-01-24 LAB — CBC WITH DIFFERENTIAL/PLATELET
Abs Immature Granulocytes: 0.03 10*3/uL (ref 0.00–0.07)
Basophils Absolute: 0.1 10*3/uL (ref 0.0–0.1)
Basophils Relative: 1 %
Eosinophils Absolute: 0.1 10*3/uL (ref 0.0–0.5)
Eosinophils Relative: 2 %
HCT: 44.8 % (ref 36.0–46.0)
Hemoglobin: 15 g/dL (ref 12.0–15.0)
Immature Granulocytes: 1 %
Lymphocytes Relative: 28 %
Lymphs Abs: 1.8 10*3/uL (ref 0.7–4.0)
MCH: 31.6 pg (ref 26.0–34.0)
MCHC: 33.5 g/dL (ref 30.0–36.0)
MCV: 94.3 fL (ref 80.0–100.0)
Monocytes Absolute: 0.5 10*3/uL (ref 0.1–1.0)
Monocytes Relative: 7 %
Neutro Abs: 4 10*3/uL (ref 1.7–7.7)
Neutrophils Relative %: 61 %
Platelets: 207 10*3/uL (ref 150–400)
RBC: 4.75 MIL/uL (ref 3.87–5.11)
RDW: 13.1 % (ref 11.5–15.5)
WBC: 6.5 10*3/uL (ref 4.0–10.5)
nRBC: 0 % (ref 0.0–0.2)

## 2020-01-24 LAB — TROPONIN I (HIGH SENSITIVITY): Troponin I (High Sensitivity): 3 ng/L (ref <18)

## 2020-01-24 MED ORDER — SODIUM CHLORIDE 0.9 % IV BOLUS
1000.0000 mL | Freq: Once | INTRAVENOUS | Status: AC
  Administered 2020-01-24: 1000 mL via INTRAVENOUS

## 2020-01-24 NOTE — ED Provider Notes (Signed)
[place] EMERGENCY DEPARTMENT Provider Note   CSN: [id number] Arrival date & time: 01/24/20  1107     History Chief Complaint  Patient presents with  . Numbness    Debbie Mclaughlin is a 64 y.o. female.  Patient is a 64 year old female with past medical history of fibromyalgia.  She presents today for evaluation of numbness to her face and arm for the past month.  She also reports episodes of nausea and lightheadedness.  She denies a spinning sensation, but describes this as a "flippy feeling" in her head.  She denies visual disturbances.  She denies fevers or chills.  She denies vomiting or diarrhea.  The history is provided by the patient.       Past Medical History:  Diagnosis Date  . Cataract    OU  . Chronic back pain   . Fibromyalgia   . Headache(784.0)   . High cholesterol   . Hypoglycemia     There are no problems to display for this patient.   Past Surgical History:  Procedure Laterality Date  . ADENOIDECTOMY    . CESAREAN SECTION    . FOOT SURGERY    . TONSILLECTOMY       OB History   No obstetric history on file.     Family History  Problem Relation Age of Onset  . Glaucoma Maternal Grandmother     Social History   Tobacco Use  . Smoking status: Current Every Day Smoker    Packs/day: 1.00    Types: Cigarettes  Substance Use Topics  . Alcohol use: Yes    Comment: rarely  . Drug use: No    Home Medications Prior to Admission medications   Medication Sig Start Date End Date Taking? Authorizing Provider  baclofen (LIORESAL) 10 MG tablet  12/03/18   [provider]  fluconazole (DIFLUCAN) 100 MG tablet TAKE 1 TABLET BY MOUTH ONCE DAILY FOR 7 DAYS 11/09/18   [provider]  fluconazole (DIFLUCAN) 150 MG tablet TAKE 1 TABLET NOW REPEAT IN 48 HOURS 06/14/18   [provider]  Multiple Vitamin (MULTIVITAMIN) capsule Take 1 capsule by mouth daily.    [provider]  nystatin cream (MYCOSTATIN) APPLY  CREAM TOPICALLY TWICE DAILY 01/01/19   [provider]  oxycodone (OXY-IR) 5 MG capsule Take 5 mg by mouth every 4 (four) hours as needed.    [provider]  oxyCODONE-acetaminophen (PERCOCET) 5-325 MG tablet Take 1 tablet by mouth every 8 (eight) hours as needed for severe pain. Patient not taking: Reported on 04/07/2019 11/12/15   Etta Quill, NP  traMADol (ULTRAM) 50 MG tablet Take 50 mg by mouth every 6 (six) hours as needed for pain.    [provider]    Allergies    Benzoyl peroxide, Latex, and Sulfa antibiotics  Review of Systems   Review of Systems  All other systems reviewed and are negative.   Physical Exam Updated Vital Signs BP 136/82 (BP Location: Right Arm)   Pulse 69   Temp 98 F (36.7 C) (Oral)   Resp 20   Ht 5\' 6"  (1.676 m)   Wt 88.5 kg   SpO2 100%   BMI 31.47 kg/m   Physical Exam Vitals and nursing note reviewed.  Constitutional:      General: She is not in acute distress.    Appearance: She is well-developed. She is not diaphoretic.  HENT:     Head: Normocephalic and atraumatic.  Eyes:  Extraocular Movements: Extraocular movements intact.     Pupils: Pupils are equal, round, and reactive to light.  Cardiovascular:     Rate and Rhythm: Normal rate and regular rhythm.     Heart sounds: No murmur. No friction rub. No gallop.   Pulmonary:     Effort: Pulmonary effort is normal. No respiratory distress.     Breath sounds: Normal breath sounds. No wheezing.  Abdominal:     General: Bowel sounds are normal. There is no distension.     Palpations: Abdomen is soft.     Tenderness: There is no abdominal tenderness.  Musculoskeletal:        General: Normal range of motion.     Cervical back: Normal range of motion and neck supple.  Skin:    General: Skin is warm and dry.  Neurological:     General: No focal deficit present.     Mental Status: She is alert and oriented to person, place, and time.     Cranial Nerves: No  cranial nerve deficit.     Coordination: Coordination normal.     ED Results / Procedures / Treatments   Labs (all labs ordered are listed, but only abnormal results are displayed) Labs Reviewed  COMPREHENSIVE METABOLIC PANEL  CBC WITH DIFFERENTIAL/PLATELET  TROPONIN I (HIGH SENSITIVITY)    EKG EKG Interpretation  Date/Time:  Tuesday January 24 2020 11:18:20 EDT Ventricular Rate:  58 PR Interval:    QRS Duration: 96 QT Interval:  439 QTC Calculation: 432 R Axis:   63 Text Interpretation: Sinus rhythm RSR' in V1 or V2, probably normal variant Baseline wander in lead(s) V2 Confirmed by Geoffery Lyons ([id number]) on 01/24/2020 11:44:00 AM   Radiology No results found.  Procedures Procedures (including critical care time)  Medications Ordered in ED Medications  sodium chloride 0.9 % bolus 1,000 mL (has no administration in time range)    ED Course  I have reviewed the triage vital signs and the nursing notes.  Pertinent labs & imaging results that were available during my care of the patient were reviewed by me and considered in my medical decision making (see chart for details).    MDM Rules/Calculators/A&P  Patient presenting with complaints of dizziness as described in the HPI.  She is also having numbness in her arm and face.  Patient is neurologically intact and head CT is negative.  Her laboratory studies are unremarkable.  I am uncertain as to the exact etiology of her symptoms, however nothing appears emergent.  Patient inquired about the possibility of anxiety or stress as being a cause.  She does report being under increased stress recently caring for a loved one with dementia.  As I have found no physical issue, stress, anxiety is a possibility.  At this point, I feel as though discharge is appropriate.  Patient understands to return if her symptoms worsen or change.  Final Clinical Impression(s) / ED Diagnoses Final diagnoses:  None    Rx / DC Orders ED  Discharge Orders    None       Geoffery Lyons, MD 01/24/20 1336

## 2020-01-24 NOTE — Discharge Instructions (Addendum)
Continue medications as previously prescribed.  Follow-up with your primary doctor if symptoms or not improving in the next week, and return to the ER if symptoms significantly worsen or change.

## 2020-01-24 NOTE — ED Triage Notes (Signed)
Pt c/o left side face and arm numbness x 1 month, spoke with pcp. Pt also reports nausea x 1 week. Pt denies HA.

## 2020-06-20 ENCOUNTER — Emergency Department (HOSPITAL_BASED_OUTPATIENT_CLINIC_OR_DEPARTMENT_OTHER): Payer: Medicare Other

## 2020-06-20 ENCOUNTER — Encounter (HOSPITAL_BASED_OUTPATIENT_CLINIC_OR_DEPARTMENT_OTHER): Payer: Self-pay

## 2020-06-20 ENCOUNTER — Emergency Department (HOSPITAL_BASED_OUTPATIENT_CLINIC_OR_DEPARTMENT_OTHER)
Admission: EM | Admit: 2020-06-20 | Discharge: 2020-06-20 | Disposition: A | Discharge status: Home / Self Care | Payer: Medicare Other | Attending: Emergency Medicine | Admitting: Emergency Medicine

## 2020-06-20 ENCOUNTER — Other Ambulatory Visit: Payer: Self-pay

## 2020-06-20 DIAGNOSIS — R1030 Lower abdominal pain, unspecified: Secondary | ICD-10-CM | POA: Diagnosis present

## 2020-06-20 DIAGNOSIS — F17210 Nicotine dependence, cigarettes, uncomplicated: Secondary | ICD-10-CM | POA: Diagnosis not present

## 2020-06-20 DIAGNOSIS — N201 Calculus of ureter: Secondary | ICD-10-CM | POA: Diagnosis not present

## 2020-06-20 DIAGNOSIS — Z91040 Latex allergy status: Secondary | ICD-10-CM | POA: Insufficient documentation

## 2020-06-20 LAB — LIPASE, BLOOD: Lipase: 25 U/L (ref 11–51)

## 2020-06-20 LAB — COMPREHENSIVE METABOLIC PANEL
ALT: 16 U/L (ref 0–44)
AST: 18 U/L (ref 15–41)
Albumin: 3.6 g/dL (ref 3.5–5.0)
Alkaline Phosphatase: 64 U/L (ref 38–126)
Anion gap: 9 (ref 5–15)
BUN: 21 mg/dL (ref 8–23)
CO2: 25 mmol/L (ref 22–32)
Calcium: 9.2 mg/dL (ref 8.9–10.3)
Chloride: 104 mmol/L (ref 98–111)
Creatinine, Ser: 0.93 mg/dL (ref 0.44–1.00)
GFR, Estimated: >60 mL/min (ref >60)
Glucose, Bld: 85 mg/dL (ref 70–99)
Potassium: 3.6 mmol/L (ref 3.5–5.1)
Sodium: 138 mmol/L (ref 135–145)
Total Bilirubin: 0.3 mg/dL (ref 0.3–1.2)
Total Protein: 6.6 g/dL (ref 6.5–8.1)

## 2020-06-20 LAB — URINALYSIS, MICROSCOPIC (REFLEX): RBC / HPF: >50 RBC/hpf (ref 0–5)

## 2020-06-20 LAB — CBC WITH DIFFERENTIAL/PLATELET
Abs Immature Granulocytes: 0.06 10*3/uL (ref 0.00–0.07)
Basophils Absolute: 0 10*3/uL (ref 0.0–0.1)
Basophils Relative: 0 %
Eosinophils Absolute: 0 10*3/uL (ref 0.0–0.5)
Eosinophils Relative: 0 %
HCT: 40.7 % (ref 36.0–46.0)
Hemoglobin: 13.9 g/dL (ref 12.0–15.0)
Immature Granulocytes: 1 %
Lymphocytes Relative: 8 %
Lymphs Abs: 0.9 10*3/uL (ref 0.7–4.0)
MCH: 32.1 pg (ref 26.0–34.0)
MCHC: 34.2 g/dL (ref 30.0–36.0)
MCV: 94 fL (ref 80.0–100.0)
Monocytes Absolute: 0.5 10*3/uL (ref 0.1–1.0)
Monocytes Relative: 5 %
Neutro Abs: 9 10*3/uL — ABNORMAL HIGH (ref 1.7–7.7)
Neutrophils Relative %: 86 %
Platelets: 199 10*3/uL (ref 150–400)
RBC: 4.33 MIL/uL (ref 3.87–5.11)
RDW: 12.5 % (ref 11.5–15.5)
WBC: 10.5 10*3/uL (ref 4.0–10.5)
nRBC: 0 % (ref 0.0–0.2)

## 2020-06-20 LAB — URINALYSIS, ROUTINE W REFLEX MICROSCOPIC
Bilirubin Urine: NEGATIVE
Glucose, UA: NEGATIVE mg/dL
Ketones, ur: NEGATIVE mg/dL
Leukocytes,Ua: NEGATIVE
Nitrite: NEGATIVE
Protein, ur: NEGATIVE mg/dL
Specific Gravity, Urine: >1.03 — ABNORMAL HIGH (ref 1.005–1.030)
pH: 5.5 (ref 5.0–8.0)

## 2020-06-20 MED ORDER — NAPROXEN 375 MG PO TABS
375.0000 mg | ORAL_TABLET | Freq: Two times a day (BID) | ORAL | 0 refills | Status: DC
Start: 2020-06-20 — End: 2023-03-11

## 2020-06-20 MED ORDER — IBUPROFEN 400 MG PO TABS
600.0000 mg | ORAL_TABLET | Freq: Once | ORAL | Status: DC
  Filled 2020-06-20: qty 1

## 2020-06-20 MED ORDER — HYDROCODONE-ACETAMINOPHEN 5-325 MG PO TABS: 1.0000 | ORAL_TABLET | Freq: Four times a day (QID) | ORAL | 0 refills | Status: AC | PRN

## 2020-06-20 MED ORDER — ONDANSETRON 8 MG PO TBDP: 8.0000 mg | ORAL_TABLET | Freq: Three times a day (TID) | ORAL | 0 refills | Status: AC | PRN

## 2020-06-20 NOTE — ED Notes (Signed)
Pt ambulatory to restroom without assitance

## 2020-06-20 NOTE — Discharge Instructions (Signed)
Take the medications as needed for pain. Follow-up with urologist if you have not passed the kidney stone within 1 week. Return to the ED as needed for worsening symptoms

## 2020-06-20 NOTE — ED Provider Notes (Signed)
[facility] EMERGENCY DEPARTMENT Provider Note   CSN: [id number] Arrival date & time: 06/20/20  1254     History Chief Complaint  Patient presents with  . Abdominal Pain    Debbie Mclaughlin is a 64 y.o. female.  HPI   Patient presented to the ED for evaluation of sudden onset of severe lower abdominal pain.  Patient states she had been having some intermittent discomfort in more her upper abdomen and back.  She saw her primary care doctor who did some blood testing.  Patient states she was supposed to get set up for a possible ultrasound.  This morning however the pain became very intense.  It was more in her lower abdomen and started to go into her left groin.  She started to feel very nauseated.  She felt like she was going to vomit.  She was trying to drive to this facility but then the pain became very intense, she felt like she was going to pass out, and she had to call EMS.  Patient was given IV fluids by the paramedics and she states that now the pain is actually all resolved.  She is not sure exactly what caused it to go away whether it was the fluids are lying back or just went away on its own but she has no pain or discomfort now.  Past Medical History:  Diagnosis Date  . Cataract    OU  . Chronic back pain   . Fibromyalgia   . Headache(784.0)   . High cholesterol   . Hypoglycemia     There are no problems to display for this patient.   Past Surgical History:  Procedure Laterality Date  . ADENOIDECTOMY    . CESAREAN SECTION    . FOOT SURGERY    . TONSILLECTOMY       OB History   No obstetric history on file.     Family History  Problem Relation Age of Onset  . Glaucoma Maternal Grandmother     Social History   Tobacco Use  . Smoking status: Current Every Day Smoker    Packs/day: 1.00    Types: Cigarettes  . Smokeless tobacco: Never Used  Substance Use Topics  . Alcohol use: Yes    Comment: rarely  . Drug use: No    Home  Medications Prior to Admission medications   Medication Sig Start Date End Date Taking? Authorizing Provider  HYDROcodone-acetaminophen (NORCO/VICODIN) 5-325 MG tablet Take 1 tablet by mouth every 6 (six) hours as needed. 06/20/20   Linwood Dibbles, MD  Multiple Vitamin (MULTIVITAMIN) capsule Take 1 capsule by mouth daily.    [provider]  naproxen (NAPROSYN) 375 MG tablet Take 1 tablet (375 mg total) by mouth 2 (two) times daily. 06/20/20   Linwood Dibbles, MD  ondansetron (ZOFRAN ODT) 8 MG disintegrating tablet Take 1 tablet (8 mg total) by mouth every 8 (eight) hours as needed for nausea or vomiting. 06/20/20   Linwood Dibbles, MD    Allergies    Benzoyl peroxide, Latex, and Sulfa antibiotics  Review of Systems   Review of Systems  All other systems reviewed and are negative.   Physical Exam Updated Vital Signs BP 99/79   Pulse (!) 52   Temp 98.1 F (36.7 C) (Oral)   Resp 18   Ht 1.689 m (5' 6.5")   Wt 90.7 kg   SpO2 98%   BMI 31.80 kg/m   Physical Exam Vitals and nursing note reviewed.  Constitutional:  General: She is not in acute distress.    Appearance: She is well-developed.  HENT:     Head: Normocephalic and atraumatic.     Right Ear: External ear normal.     Left Ear: External ear normal.  Eyes:     General: No scleral icterus.       Right eye: No discharge.        Left eye: No discharge.     Conjunctiva/sclera: Conjunctivae normal.  Neck:     Trachea: No tracheal deviation.  Cardiovascular:     Rate and Rhythm: Normal rate and regular rhythm.  Pulmonary:     Effort: Pulmonary effort is normal. No respiratory distress.     Breath sounds: Normal breath sounds. No stridor. No wheezing or rales.  Abdominal:     General: Bowel sounds are normal. There is no distension.     Palpations: Abdomen is soft.     Tenderness: There is no abdominal tenderness. There is no guarding or rebound.  Musculoskeletal:        General: No tenderness.     Cervical back: Neck  supple.  Skin:    General: Skin is warm and dry.     Findings: No rash.  Neurological:     Mental Status: She is alert.     Cranial Nerves: No cranial nerve deficit (no facial droop, extraocular movements intact, no slurred speech).     Sensory: No sensory deficit.     Motor: No abnormal muscle tone or seizure activity.     Coordination: Coordination normal.     ED Results / Procedures / Treatments   Labs (all labs ordered are listed, but only abnormal results are displayed) Labs Reviewed  CBC WITH DIFFERENTIAL/PLATELET - Abnormal; Notable for the following components:      Result Value   Neutro Abs 9.0 (*)    All other components within normal limits  URINALYSIS, ROUTINE W REFLEX MICROSCOPIC - Abnormal; Notable for the following components:   APPearance HAZY (*)    Specific Gravity, Urine >1.030 (*)    Hgb urine dipstick LARGE (*)    All other components within normal limits  URINALYSIS, MICROSCOPIC (REFLEX) - Abnormal; Notable for the following components:   Bacteria, UA FEW (*)    All other components within normal limits  COMPREHENSIVE METABOLIC PANEL  LIPASE, BLOOD    EKG None  Radiology CT Renal Stone Study  Result Date: 06/20/2020 CLINICAL DATA:  Left flank pain.  Lower abdominal pain.  Hematuria. EXAM: CT ABDOMEN AND PELVIS WITHOUT CONTRAST TECHNIQUE: Multidetector CT imaging of the abdomen and pelvis was performed following the standard protocol without IV contrast. COMPARISON:  CT 11/12/2015 FINDINGS: Lower chest: Ground-glass opacities in both lower lobes have a dependent predominant distribution and appears slightly linear, but are indeterminate for atelectasis versus viral pneumonia. Heart is normal in size. Hepatobiliary: Water density lesion in the left lobe of the liver measuring 2.9 cm has slightly increased in size from prior exam but is consistent with a benign cyst. Subcentimeter low-density lesion in the subcapsular left lobe adjacent to the gallbladder  fossa is too small to characterize. Decompressed gallbladder without calcified gallstone or pericholecystic inflammation. There is no biliary dilatation. Pancreas: No ductal dilatation or inflammation. Spleen: Normal in size without focal abnormality. Adrenals/Urinary Tract: Low-density left adrenal nodule measuring 15 mm is consistent with adenoma. Right adrenal gland appears normal. There is an obstructing 3 x 4 mm stone in the distal left ureter with mild proximal hydroureteronephrosis.  Minimal left perinephric edema. There is no right hydronephrosis. The previous punctate nonobstructing stone in the right kidney is no longer seen. 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 The urinary bladder is partially distended. No bladder stone or wall thickening. Stomach/Bowel: Small hiatal hernia. Stomach partially distended. There is no small bowel obstruction or inflammatory change. Normal appendix. Small volume of colonic stool. Mild colonic diverticulosis involving the descending and sigmoid colon. No diverticulitis. No acute colonic inflammation. Vascular/Lymphatic: Mild aorto bi-iliac atherosclerosis. No aortic aneurysm. There is no bulky abdominopelvic adenopathy. Few prominent inguinal lymph nodes are stable from prior exam and typically reactive. Reproductive: Uterus is normal for age. Quiescent ovaries. No adnexal mass. Other: Trace free fluid in the pelvis may be reactive. No focal fluid collection. No free air. Musculoskeletal: There are no acute or suspicious osseous abnormalities. Degenerative change in the lower lumbar spine with primarily facet hypertrophy. IMPRESSION: 1. Obstructing 3 x 4 mm stone in the distal left ureter with mild proximal hydroureteronephrosis. 2. Ground-glass opacities in both lower lobes have a dependent predominant distribution and appears slightly linear, favor atelectasis, however COVID pneumonia can appear similar. Recommend COVID testing. 3. Colonic  diverticulosis without diverticulitis. 4. Left adrenal adenoma. Aortic Atherosclerosis (ICD10-I70.0). Electronically Signed   By: Narda RutherfordMelanie  Sanford M.D.   On: 06/20/2020 15:21    Procedures Procedures (including critical care time)  Medications Ordered in ED Medications  ibuprofen (ADVIL) tablet 600 mg (has no administration in time range)    ED Course  I have reviewed the triage vital signs and the nursing notes.  Pertinent labs & imaging results that were available during my care of the patient were reviewed by me and considered in my medical decision making (see chart for details).  Clinical Course as of Jun 20 1549  Wed Jun 20, 2020  1544 Discussed CT scan findings with the patient. She does have a ureteral stone. Patient does not want anything stronger and IV.   [JK]  1544 Labs reviewed. CBC and metabolic panel unremarkable. Urinalysis does show hematuria. CT scan findings reviewed and patient does have a left-sided ureteral stone   [JK]  1545 CT scan pulmonary findings noted. Patient's not having any respiratory symptoms. She has been vaccinated for covid.  No indication for Covid testing at this time   [JK]    Clinical Course User Index [JK] Linwood DibblesKnapp, Hasina Kreager, MD   MDM Rules/Calculators/A&P                         Patient presented with acute onset of abdominal pain.  Symptoms all resolved now in the ED.  Symptoms most concerning for the possibility of ureteral colic.  Diverticulitis, perforated viscus, abdominal aortic dissection less likely.  Labs notable for hematuria.  Otherwise unremarkable.  We will plan on CT scan.  CT scan confirms left-sided ureteral stone. Does not want any IV pain medications and does not want anything that will make her sleepy. We will give her a dose of ibuprofen. We will give her prescription for stronger pain medications recommend outpatient follow-up with urology. Final Clinical Impression(s) / ED Diagnoses Final diagnoses:  Ureterolithiasis     Rx / DC Orders ED Discharge Orders         Ordered    HYDROcodone-acetaminophen (NORCO/VICODIN) 5-325 MG tablet  Every 6 hours PRN        06/20/20 1547    ondansetron (ZOFRAN ODT) 8 MG disintegrating tablet  Every 8 hours PRN  06/20/20 1547    naproxen (NAPROSYN) 375 MG tablet  2 times daily        06/20/20 1547           Linwood Dibbles, MD 06/20/20 1550

## 2020-06-20 NOTE — ED Triage Notes (Signed)
Pt complaint of sudden on set of lower ABD pain today, pt complaint of pain has left after IVF from EMS. Pt was attempting to take UA to PCP office.

## 2021-12-15 ENCOUNTER — Emergency Department (HOSPITAL_BASED_OUTPATIENT_CLINIC_OR_DEPARTMENT_OTHER)
Admission: EM | Admit: 2021-12-15 | Discharge: 2021-12-15 | Disposition: A | Discharge status: Home / Self Care | Payer: Medicare Other | Attending: Emergency Medicine | Admitting: Emergency Medicine

## 2021-12-15 ENCOUNTER — Emergency Department (HOSPITAL_BASED_OUTPATIENT_CLINIC_OR_DEPARTMENT_OTHER): Payer: Medicare Other

## 2021-12-15 ENCOUNTER — Encounter (HOSPITAL_BASED_OUTPATIENT_CLINIC_OR_DEPARTMENT_OTHER): Payer: Self-pay | Admitting: Urology

## 2021-12-15 DIAGNOSIS — M25561 Pain in right knee: Secondary | ICD-10-CM | POA: Diagnosis not present

## 2021-12-15 DIAGNOSIS — W1839XA Other fall on same level, initial encounter: Secondary | ICD-10-CM | POA: Diagnosis not present

## 2021-12-15 DIAGNOSIS — M79641 Pain in right hand: Secondary | ICD-10-CM

## 2021-12-15 DIAGNOSIS — W19XXXA Unspecified fall, initial encounter: Secondary | ICD-10-CM

## 2021-12-15 DIAGNOSIS — M79642 Pain in left hand: Secondary | ICD-10-CM

## 2021-12-15 DIAGNOSIS — M25562 Pain in left knee: Secondary | ICD-10-CM

## 2021-12-15 DIAGNOSIS — M797 Fibromyalgia: Secondary | ICD-10-CM | POA: Insufficient documentation

## 2021-12-15 MED ORDER — IBUPROFEN 600 MG PO TABS: 600.0000 mg | ORAL_TABLET | Freq: Four times a day (QID) | ORAL | 0 refills | Status: DC | PRN

## 2021-12-15 NOTE — ED Notes (Signed)
Pt stated the knee brace was "worse than nothing", stated that she would prefer just an Ace wrap instead.  ?

## 2021-12-15 NOTE — Discharge Instructions (Addendum)
You were seen in the emergency department for evaluation of your bilateral hand and left knee pain after a fall today.  Your imaging did not show any signs of fracture.  I would still like for you to follow-up with an orthopedic provider given your knee pain and swelling.  I have included information for the orthopedic in this discharge paperwork.  Please call to schedule an appointment.  I have also sent you in a prescription for ibuprofen 600 mg to take every 6 hours as needed.  I would at least take this regimen for the first 24 to 48 hours to stay on top of the pain.  If you have any concern, new or worsening symptoms, please return to the nearest emergency department for reevaluation. ? ?Contact a doctor if: ?The knee pain does not stop. ?The knee pain changes or gets worse. ?You have a fever along with knee pain. ?Your knee is red or feels warm when you touch it. ?Your knee gives out or locks up. ?Get help right away if: ?Your knee swells, and the swelling gets worse. ?You cannot move your knee. ?You have very bad knee pain that does not get better with pain medicine. ?

## 2021-12-15 NOTE — ED Provider Notes (Signed)
?[facility] EMERGENCY DEPARTMENT ?Provider Note ? ? ?CSN: [id number] ?Arrival date & time: 12/15/21  1630 ? ?  ? ?History ?Chief Complaint  ?Patient presents with  ? Fall  ? ? ?Debbie Mclaughlin is a 66 y.o. female fibromyalgia presents the emergency department after mechanical fall today around 0730.  The patient reports that her bellybutton abdominal pannus that caught on her husband's ramp leading to the outside and she fell about 1 foot above ground-level landing on her bilateral outstretched hands.  She denies any head injury or loss of consciousness.  She is complaining of left knee pain and bilateral hand pain.  She is not on any blood thinners.  She has not tried anything for pain.  She reports she was walking on her knee earlier it just causes her pain.  Denies any numbness, tingling, or weakness to the area.  She denies any back or neck pain.  She denies any chest or abdominal pain. ? ? ? ? ?Fall ? ? ?  ? ?Home Medications ?Prior to Admission medications   ?Medication Sig Start Date End Date Taking? Authorizing Provider  ?HYDROcodone-acetaminophen (NORCO/VICODIN) 5-325 MG tablet Take 1 tablet by mouth every 6 (six) hours as needed. 06/20/20   Linwood DibblesKnapp, Jon, MD  ?Multiple Vitamin (MULTIVITAMIN) capsule Take 1 capsule by mouth daily.    [provider]  ?naproxen (NAPROSYN) 375 MG tablet Take 1 tablet (375 mg total) by mouth 2 (two) times daily. 06/20/20   Linwood DibblesKnapp, Jon, MD  ?ondansetron (ZOFRAN ODT) 8 MG disintegrating tablet Take 1 tablet (8 mg total) by mouth every 8 (eight) hours as needed for nausea or vomiting. 06/20/20   Linwood DibblesKnapp, Jon, MD  ?   ? ?Allergies    ?Latex and Sulfa antibiotics   ? ?Review of Systems   ?Review of Systems  ?Musculoskeletal:  Positive for arthralgias and myalgias. Negative for back pain and neck pain.  ? ?Physical Exam ?Updated Vital Signs ?BP 106/66 (BP Location: Right Arm)   Pulse 85   Temp 98.5 ?F (36.9 ?C) (Oral)   Resp 18   Ht 5\' 6"  (1.676 m)   Wt 90.7 kg   SpO2 96%    BMI 32.27 kg/m?  ?Physical Exam ?Vitals and nursing note reviewed.  ?Constitutional:   ?   Appearance: Normal appearance.  ?HENT:  ?   Head: Normocephalic and atraumatic.  ?   Comments: Patient's scalp appear atraumatic.  She has no facial tenderness or any head/scalp tenderness.  No step-offs or deformities visualized or palpated. ?Eyes:  ?   General: No scleral icterus. ?Neck:  ?   Comments: No midline or paraspinal cervical tenderness.  No step-offs or deformities noted.  No overlying skin changes.  No increased warmth or erythema. ?Cardiovascular:  ?   Rate and Rhythm: Normal rate.  ?Pulmonary:  ?   Effort: Pulmonary effort is normal. No respiratory distress.  ?   Breath sounds: Normal breath sounds.  ?Abdominal:  ?   General: Bowel sounds are normal.  ?   Palpations: Abdomen is soft.  ?   Tenderness: There is no abdominal tenderness. There is no guarding or rebound.  ?Musculoskeletal:     ?   General: Tenderness present. No deformity. Normal range of motion.  ?   Cervical back: Normal range of motion. No tenderness.  ?   Comments: BACK -no midline or paraspinal cervical, thoracic, or lumbar tenderness palpation.  No step-offs or deformities visualized or palpated.  No signs of trauma. ? ?RUE -  no tenderness to the shoulder or elbow.  No signs of trauma.  Patient does have tenderness over the thenar eminence of her hand.  No snuffbox tenderness however.  She has good cap refill to the thumb and fingers.  She is able to AB and adduct the thumb. She has full ROM of thumb, wrist, and fingers. Compartments are soft. Palpable radial pulse.  Sensation intact. ? ?LUE- no tenderness to the shoulder or elbow.  She does have a superficial abrasion on her left elbow, otherwise no signs of trauma.  Patient does have tenderness over the thenar eminence of her hand. She reports her left is more tender than her right.  No snuffbox tenderness however.  She has good cap refill to the thumb and fingers.  She is able to AB and  adduct the thumb. She has full ROM of thumb, wrist, and fingers. Compartments are soft. Palpable radial pulse.  Sensation intact. ? ?RLE-compartments are soft.  Palpable DP and PT pulses.  Patient has full range of motion of knee and ankle with pain.  Sensation intact.  She does have some bruising and swelling noted to the more medial aspect of the knee.  She is tender to touch.  There is no obvious deformity or step-off noted to the area.  She is ambulatory and weightbearing, just with pain. ? ?LLE- compartments are soft.  Palpable DP and PT pulses.  Patient has full range of motion of knee and ankle.  Sensation intact.  No signs of trauma. ?  ?Skin: ?   General: Skin is dry.  ?   Findings: No rash.  ?Neurological:  ?   General: No focal deficit present.  ?   Mental Status: She is alert. Mental status is at baseline.  ?Psychiatric:     ?   Mood and Affect: Mood normal.  ? ? ?ED Results / Procedures / Treatments   ?Labs ?(all labs ordered are listed, but only abnormal results are displayed) ?Labs Reviewed - No data to display ? ?EKG ?None ? ?Radiology ?DG Wrist Complete Left ? ?Result Date: 12/15/2021 ?CLINICAL DATA:  Status post fall. EXAM: LEFT WRIST - COMPLETE 3+ VIEW COMPARISON:  None. FINDINGS: There is no evidence of fracture or dislocation. There is no evidence of arthropathy or other focal bone abnormality. Soft tissues are unremarkable. IMPRESSION: Negative. Electronically Signed   By: Aram Candela M.D.   On: 12/15/2021 19:21  ? ?DG Wrist Complete Right ? ?Result Date: 12/15/2021 ?CLINICAL DATA:  Status post fall. EXAM: RIGHT WRIST - COMPLETE 3+ VIEW COMPARISON:  None. FINDINGS: There is no evidence of fracture or dislocation. Mild degenerative changes are seen involving the carpometacarpal articulation of the right thumb. Soft tissues are unremarkable. IMPRESSION: No acute osseous abnormality. Electronically Signed   By: Aram Candela M.D.   On: 12/15/2021 19:22  ? ?DG Knee Complete 4 Views  Right ? ?Result Date: 12/15/2021 ?CLINICAL DATA:  Fall on concrete today, right knee pain. EXAM: RIGHT KNEE - COMPLETE 4+ VIEW COMPARISON:  None. FINDINGS: No visualized fracture. There is a large knee joint effusion, no convincing lipohemarthrosis. Normal alignment and joint spaces. Trace patellar spurring. Prepatellar soft tissue edema. IMPRESSION: 1. Large knee joint effusion without acute fracture. 2. Prepatellar soft tissue edema. Electronically Signed   By: Narda Rutherford M.D.   On: 12/15/2021 17:36  ? ?DG Hand Complete Left ? ?Result Date: 12/15/2021 ?CLINICAL DATA:  Fall today with hand pain. EXAM: LEFT HAND - COMPLETE 3+ VIEW COMPARISON:  None. FINDINGS: There is no evidence of fracture or dislocation. Minor osteoarthritis with spurring. Soft tissues are unremarkable. IMPRESSION: No fracture or subluxation of the left hand. Electronically Signed   By: Narda Rutherford M.D.   On: 12/15/2021 17:39  ? ?DG Hand Complete Right ? ?Result Date: 12/15/2021 ?CLINICAL DATA:  Fall on concrete today, hand pain. EXAM: RIGHT HAND - COMPLETE 3+ VIEW COMPARISON:  None. FINDINGS: There is no evidence of fracture or dislocation. Tiny corticated density about the second digit distal interphalangeal joint is well corticated and chronic. Minor osteoarthritis with spurring. Soft tissues are unremarkable. IMPRESSION: 1. No acute fracture or subluxation of the right hand. 2. Minor osteoarthritis. Electronically Signed   By: Narda Rutherford M.D.   On: 12/15/2021 17:38   ? ?Procedures ?Procedures  ? ?Medications Ordered in ED ?Medications - No data to display ? ?ED Course/ Medical Decision Making/ A&P ?  ?                        ?Medical Decision Making ?Amount and/or Complexity of Data Reviewed ?Radiology: ordered. ? ?Risk ?Prescription drug management. ? ? ?66 year old female presents emerged department for evaluation of bilateral hand pain and right knee pain after mechanical fall today.  Differential diagnosis includes but is  not limited to wrist fracture, hand fracture, knee fracture, dislocation, ligament or tendon damage, contusion.  Vital signs are unremarkable.  Patient normotensive, afebrile, normal pulse rate, satting well on room a

## 2021-12-15 NOTE — ED Triage Notes (Signed)
Fall on concrete today, tripped on ramp after pants got caught on it  ?Right knee and bilateral wrist pain and hand pain  ?Denies hitting head/ LOC  ?Not on blood thinners  ?

## 2021-12-19 ENCOUNTER — Other Ambulatory Visit (HOSPITAL_BASED_OUTPATIENT_CLINIC_OR_DEPARTMENT_OTHER): Payer: Self-pay | Admitting: Physician Assistant

## 2021-12-19 DIAGNOSIS — Z1231 Encounter for screening mammogram for malignant neoplasm of breast: Secondary | ICD-10-CM

## 2021-12-25 ENCOUNTER — Ambulatory Visit (HOSPITAL_BASED_OUTPATIENT_CLINIC_OR_DEPARTMENT_OTHER)
Admission: RE | Admit: 2021-12-25 | Discharge: 2021-12-25 | Disposition: A | Discharge status: Home / Self Care | Payer: Medicare Other | Source: Ambulatory Visit | Attending: Physician Assistant | Admitting: Physician Assistant

## 2021-12-25 ENCOUNTER — Encounter (HOSPITAL_BASED_OUTPATIENT_CLINIC_OR_DEPARTMENT_OTHER): Payer: Self-pay

## 2021-12-25 DIAGNOSIS — Z1231 Encounter for screening mammogram for malignant neoplasm of breast: Secondary | ICD-10-CM | POA: Insufficient documentation

## 2022-07-17 ENCOUNTER — Other Ambulatory Visit: Payer: Self-pay | Admitting: Orthopedic Surgery

## 2022-07-17 DIAGNOSIS — M6740 Ganglion, unspecified site: Secondary | ICD-10-CM

## 2022-08-01 ENCOUNTER — Ambulatory Visit
Admission: RE | Admit: 2022-08-01 | Discharge: 2022-08-01 | Disposition: A | Discharge status: Home / Self Care | Payer: Medicare Other | Source: Ambulatory Visit | Attending: Orthopedic Surgery | Admitting: Orthopedic Surgery

## 2022-08-01 DIAGNOSIS — M6740 Ganglion, unspecified site: Secondary | ICD-10-CM

## 2022-12-20 ENCOUNTER — Emergency Department (HOSPITAL_BASED_OUTPATIENT_CLINIC_OR_DEPARTMENT_OTHER)
Admission: EM | Admit: 2022-12-20 | Discharge: 2022-12-20 | Disposition: A | Discharge status: Home / Self Care | Payer: Medicare Other | Attending: Emergency Medicine | Admitting: Emergency Medicine

## 2022-12-20 ENCOUNTER — Encounter (HOSPITAL_BASED_OUTPATIENT_CLINIC_OR_DEPARTMENT_OTHER): Payer: Self-pay | Admitting: Emergency Medicine

## 2022-12-20 ENCOUNTER — Emergency Department (HOSPITAL_BASED_OUTPATIENT_CLINIC_OR_DEPARTMENT_OTHER): Payer: Medicare Other

## 2022-12-20 DIAGNOSIS — R300 Dysuria: Secondary | ICD-10-CM | POA: Diagnosis present

## 2022-12-20 DIAGNOSIS — N3001 Acute cystitis with hematuria: Secondary | ICD-10-CM | POA: Diagnosis not present

## 2022-12-20 DIAGNOSIS — Z91040 Latex allergy status: Secondary | ICD-10-CM | POA: Diagnosis not present

## 2022-12-20 LAB — COMPREHENSIVE METABOLIC PANEL
ALT: 19 U/L (ref 0–44)
AST: 22 U/L (ref 15–41)
Albumin: 4 g/dL (ref 3.5–5.0)
Alkaline Phosphatase: 95 U/L (ref 38–126)
Anion gap: 8 (ref 5–15)
BUN: 24 mg/dL — ABNORMAL HIGH (ref 8–23)
CO2: 26 mmol/L (ref 22–32)
Calcium: 9.4 mg/dL (ref 8.9–10.3)
Chloride: 106 mmol/L (ref 98–111)
Creatinine, Ser: 1.08 mg/dL — ABNORMAL HIGH (ref 0.44–1.00)
GFR, Estimated: 57 mL/min — ABNORMAL LOW (ref >60)
Glucose, Bld: 96 mg/dL (ref 70–99)
Potassium: 4.2 mmol/L (ref 3.5–5.1)
Sodium: 140 mmol/L (ref 135–145)
Total Bilirubin: 0.5 mg/dL (ref 0.3–1.2)
Total Protein: 7.1 g/dL (ref 6.5–8.1)

## 2022-12-20 LAB — URINALYSIS, W/ REFLEX TO CULTURE (INFECTION SUSPECTED)
Glucose, UA: 100 mg/dL — AB
Ketones, ur: 15 mg/dL — AB
Nitrite: POSITIVE — AB
Protein, ur: 100 mg/dL — AB
Specific Gravity, Urine: 1.02 (ref 1.005–1.030)
WBC, UA: >50 WBC/hpf (ref 0–5)
pH: 5 (ref 5.0–8.0)

## 2022-12-20 LAB — CBC WITH DIFFERENTIAL/PLATELET
Abs Immature Granulocytes: 0.03 10*3/uL (ref 0.00–0.07)
Basophils Absolute: 0.1 10*3/uL (ref 0.0–0.1)
Basophils Relative: 1 %
Eosinophils Absolute: 0.1 10*3/uL (ref 0.0–0.5)
Eosinophils Relative: 2 %
HCT: 43.9 % (ref 36.0–46.0)
Hemoglobin: 15 g/dL (ref 12.0–15.0)
Immature Granulocytes: 1 %
Lymphocytes Relative: 27 %
Lymphs Abs: 1.8 10*3/uL (ref 0.7–4.0)
MCH: 31.8 pg (ref 26.0–34.0)
MCHC: 34.2 g/dL (ref 30.0–36.0)
MCV: 93 fL (ref 80.0–100.0)
Monocytes Absolute: 0.6 10*3/uL (ref 0.1–1.0)
Monocytes Relative: 9 %
Neutro Abs: 4.1 10*3/uL (ref 1.7–7.7)
Neutrophils Relative %: 60 %
Platelets: 220 10*3/uL (ref 150–400)
RBC: 4.72 MIL/uL (ref 3.87–5.11)
RDW: 13 % (ref 11.5–15.5)
WBC: 6.6 10*3/uL (ref 4.0–10.5)
nRBC: 0 % (ref 0.0–0.2)

## 2022-12-20 MED ORDER — SODIUM CHLORIDE 0.9 % IV BOLUS
500.0000 mL | Freq: Once | INTRAVENOUS | Status: AC
  Administered 2022-12-20: 500 mL via INTRAVENOUS

## 2022-12-20 MED ORDER — PHENAZOPYRIDINE HCL 200 MG PO TABS: 200.0000 mg | ORAL_TABLET | Freq: Three times a day (TID) | ORAL | 0 refills | Status: DC

## 2022-12-20 MED ORDER — KETOROLAC TROMETHAMINE 15 MG/ML IJ SOLN
15.0000 mg | Freq: Once | INTRAMUSCULAR | Status: AC
  Administered 2022-12-20: 15 mg via INTRAVENOUS
  Filled 2022-12-20: qty 1

## 2022-12-20 MED ORDER — CEFPODOXIME PROXETIL 200 MG PO TABS: 200.0000 mg | ORAL_TABLET | Freq: Two times a day (BID) | ORAL | 0 refills | Status: DC

## 2022-12-20 MED ORDER — MELOXICAM 15 MG PO TABS: 15.0000 mg | ORAL_TABLET | Freq: Every day | ORAL | 0 refills | Status: DC | PRN

## 2022-12-20 MED ORDER — MORPHINE SULFATE (PF) 4 MG/ML IV SOLN: 4.0000 mg | Freq: Once | INTRAVENOUS | Status: DC

## 2022-12-20 MED ORDER — MELOXICAM 15 MG PO TABS
15.0000 mg | ORAL_TABLET | Freq: Every day | ORAL | 0 refills | Status: AC | PRN
Start: 2022-12-20 — End: ?

## 2022-12-20 MED ORDER — SODIUM CHLORIDE 0.9 % IV SOLN
1.0000 g | Freq: Once | INTRAVENOUS | Status: AC
  Administered 2022-12-20: 1 g via INTRAVENOUS
  Filled 2022-12-20: qty 10

## 2022-12-20 MED ORDER — PHENAZOPYRIDINE HCL 200 MG PO TABS: 200.0000 mg | ORAL_TABLET | Freq: Three times a day (TID) | ORAL | 0 refills | Status: AC

## 2022-12-20 NOTE — ED Triage Notes (Signed)
Pt was dx w/ cystitis yesterday; sts sxs are worse today; took 2 doses of abx; c/o abd pain, back pain, bladder spasms and incontinence

## 2022-12-20 NOTE — Discharge Instructions (Addendum)
Note the workup today was overall consistent with bladder infection.  As discussed, we will send an anti-inflammatory medication called meloxicam to take daily.  Will also prescribe Pyridium to take as needed for burning sensation when you urinate.  Will change antibiotic to Vantin.  Recommend follow-up with urology for reassessment of your symptoms.  Please do not hesitate to return to emergency department for worrisome signs and symptoms we discussed become apparent.

## 2022-12-20 NOTE — ED Provider Notes (Signed)
[facility] Provider Note   CSN: [id number] Arrival date & time: 12/20/22  1622     History  Chief Complaint  Patient presents with   Dysuria    Aleiza Hammitt is a 67 y.o. female.   Dysuria   67 year old female presents emergency department complaints of dysuria, lower abdominal pain, right-sided flank pain.  Patient states that symptoms began 4 to 5 days ago.  States she saw primary care yesterday who prescribed cefuroxime which she took.  Presents emergency department due to increased pain.  Reports feelings of nausea with no emesis.  Reports some feelings of "bladder spasms" with subsequent urinating on herself.  Has noted persistent bleeding prior to taking Azo earlier today.  Denies fever, chest pain, shortness of breath, vaginal symptoms, change in bowel habits.  Past medical history significant for chronic back pain, fibromyalgia, headache, hypercholesterolemia, hypoglycemia  Home Medications Prior to Admission medications   Medication Sig Start Date End Date Taking? Authorizing Provider  cefpodoxime (VANTIN) 200 MG tablet Take 1 tablet (200 mg total) by mouth 2 (two) times daily. 12/20/22  Yes Sherian Maroon A, PA  meloxicam (MOBIC) 15 MG tablet Take 1 tablet (15 mg total) by mouth daily as needed for pain. 12/20/22  Yes Sherian Maroon A, PA  phenazopyridine (PYRIDIUM) 200 MG tablet Take 1 tablet (200 mg total) by mouth 3 (three) times daily. 12/20/22  Yes Sherian Maroon A, PA  HYDROcodone-acetaminophen (NORCO/VICODIN) 5-325 MG tablet Take 1 tablet by mouth every 6 (six) hours as needed. 06/20/20   Linwood Dibbles, MD  Multiple Vitamin (MULTIVITAMIN) capsule Take 1 capsule by mouth daily.    [provider]  naproxen (NAPROSYN) 375 MG tablet Take 1 tablet (375 mg total) by mouth 2 (two) times daily. 06/20/20   Linwood Dibbles, MD  ondansetron (ZOFRAN ODT) 8 MG disintegrating tablet Take 1 tablet (8 mg total) by mouth every 8 (eight) hours  as needed for nausea or vomiting. 06/20/20   Linwood Dibbles, MD      Allergies    Duloxetine hcl, Ezetimibe, Sulfasalazine, Latex, and Sulfa antibiotics    Review of Systems   Review of Systems  Genitourinary:  Positive for dysuria.  All other systems reviewed and are negative.   Physical Exam Updated Vital Signs BP 116/84 (BP Location: Right Arm)   Pulse 67   Temp 98.4 F (36.9 C) (Oral)   Resp 18   Ht 5\' 6"  (1.676 m)   Wt 97.5 kg   SpO2 96%   BMI 34.70 kg/m  Physical Exam Vitals and nursing note reviewed.  Constitutional:      General: She is not in acute distress.    Appearance: She is well-developed.  HENT:     Head: Normocephalic and atraumatic.  Eyes:     Conjunctiva/sclera: Conjunctivae normal.  Cardiovascular:     Rate and Rhythm: Normal rate and regular rhythm.     Heart sounds: No murmur heard. Pulmonary:     Effort: Pulmonary effort is normal. No respiratory distress.     Breath sounds: Normal breath sounds.  Abdominal:     Palpations: Abdomen is soft.     Tenderness: There is abdominal tenderness. There is right CVA tenderness. There is no left CVA tenderness.     Comments: Suprapubic tenderness to palpation  Musculoskeletal:        General: No swelling.     Cervical back: Neck supple.     Left lower leg: No edema.  Skin:    General: Skin is warm and dry.     Capillary Refill: Capillary refill takes less than 2 seconds.  Neurological:     Mental Status: She is alert.  Psychiatric:        Mood and Affect: Mood normal.     ED Results / Procedures / Treatments   Labs (all labs ordered are listed, but only abnormal results are displayed) Labs Reviewed  URINALYSIS, W/ REFLEX TO CULTURE (INFECTION SUSPECTED) - Abnormal; Notable for the following components:      Result Value   Color, Urine ORANGE (*)    APPearance HAZY (*)    Glucose, UA 100 (*)    Hgb urine dipstick MODERATE (*)    Bilirubin Urine SMALL (*)    Ketones, ur 15 (*)    Protein, ur  100 (*)    Nitrite POSITIVE (*)    Leukocytes,Ua LARGE (*)    Bacteria, UA MANY (*)    All other components within normal limits  COMPREHENSIVE METABOLIC PANEL - Abnormal; Notable for the following components:   BUN 24 (*)    Creatinine, Ser 1.08 (*)    GFR, Estimated 57 (*)    All other components within normal limits  URINE CULTURE  CBC WITH DIFFERENTIAL/PLATELET    EKG None  Radiology CT Renal Stone Study  Result Date: 12/20/2022 CLINICAL DATA:  Abdominal/flank pain, stone suspected EXAM: CT ABDOMEN AND PELVIS WITHOUT CONTRAST TECHNIQUE: Multidetector CT imaging of the abdomen and pelvis was performed following the standard protocol without IV contrast. RADIATION DOSE REDUCTION: This exam was performed according to the departmental dose-optimization program which includes automated exposure control, adjustment of the mA and/or kV according to patient size and/or use of iterative reconstruction technique. COMPARISON:  June 20, 2020 FINDINGS: Evaluation is limited by lack of IV contrast. Lower chest: No acute abnormality. Hepatobiliary: Similar appearance of a hepatic cyst. Gallbladder is unremarkable. No extrahepatic biliary ductal dilation. Pancreas: No peripancreatic fat stranding. Similar coarse calcification in the head of the pancreas likely reflecting sequela of remote prior pancreatitis. Spleen: Unremarkable. Adrenals/Urinary Tract: Similar 15 mm benign LEFT adrenal adenoma (for which no dedicated imaging follow-up is recommended). No hydronephrosis. 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 Circumferential bladder wall thickening with adjacent fat stranding. Stomach/Bowel: No evidence of bowel obstruction. Scattered diverticulosis. No evidence of appendicitis. Small hiatal hernia. Vascular/Lymphatic: Atherosclerotic calcifications of the nonaneurysmal abdominal aorta. No new suspicious lymphadenopathy. Reproductive: Uterus and bilateral adnexa are unremarkable. Other:  No free air or free fluid. Musculoskeletal: No acute or significant osseous findings. IMPRESSION: 1. Circumferential bladder wall thickening with adjacent fat stranding is most consistent with cystitis. Recommend correlation with urinalysis. Aortic Atherosclerosis (ICD10-I70.0). Electronically Signed   By: Meda Klinefelter M.D.   On: 12/20/2022 17:48    Procedures Procedures    Medications Ordered in ED Medications  sodium chloride 0.9 % bolus 500 mL (0 mLs Intravenous Stopped 12/20/22 1800)  cefTRIAXone (ROCEPHIN) 1 g in sodium chloride 0.9 % 100 mL IVPB (0 g Intravenous Stopped 12/20/22 1800)  ketorolac (TORADOL) 15 MG/ML injection 15 mg (15 mg Intravenous Given 12/20/22 1713)    ED Course/ Medical Decision Making/ A&P                             Medical Decision Making Amount and/or Complexity of Data Reviewed Labs: ordered. Radiology: ordered.  Risk Prescription drug management.   This patient presents to the ED  for concern of abdominal pain, this involves an extensive number of treatment options, and is a complaint that carries with it a high risk of complications and morbidity.  The differential diagnosis includes gastritis, PUD, pancreatitis, CBD pathology, cholecystitis, hepatitis, SBO/LBO, volvulus, diverticulitis, appendicitis, pyelonephritis, nephrolithiasis, cystitis, malignancy, ovarian torsion   Co morbidities that complicate the patient evaluation  See HPI   Additional history obtained:  Additional history obtained from EMR External records from outside source obtained and reviewed including hospital records   Lab Tests:  I Ordered, and personally interpreted labs.  The pertinent results include: No leukocytosis noted.  No evidence anemia.  Platelets within normal range.  UA significant for urinary tract infection with many bacteria, greater than 50 WBCs, large leukocytes, positive nitrite.  Patient also with evidence of 11-20 RBCs, 100 proteins, 15 ketones, small  bilirubin, moderate hemoglobin, 100 glucose.  Urine culture pending.  Patient with creatinine of 1.08, BUN of 24 and GFR 57 of which seems to be near patient's baseline.  No electrolyte abnormalities.  No transaminitis.  Urine culture pending.   Imaging Studies ordered:  I ordered imaging studies including CT renal stone study I independently visualized and interpreted imaging which showed circumferential bladder wall thickening with adjacent fat stranding most consistent with cystitis.  Aortic atherosclerosis. I agree with the radiologist interpretation  Cardiac Monitoring: / EKG:  The patient was maintained on a cardiac monitor.  I personally viewed and interpreted the cardiac monitored which showed an underlying rhythm of: Sinus rhythm   Consultations Obtained:  N/a   Problem List / ED Course / Critical interventions / Medication management  Cystitis I ordered medication including Toradol, 500 cc normal saline, 1 g Rocephin   Reevaluation of the patient after these medicines showed that the patient improved I have reviewed the patients home medicines and have made adjustments as needed   Social Determinants of Health:  Chronic cigarette use.  Denies illicit drug use   Test / Admission - Considered:  Cystitis Vitals signs within normal range and stable throughout visit. Laboratory/imaging studies significant for: See above 67 year old female presents emergency department with complaints of urinary frequency, dysuria and suprapubic abdominal pain.  Patient found with evidence of significant urinary tract infection with hematuria.  Given patient's right-sided flank pain, some concern initially for development of pyelonephritis versus nephrolithiasis of which no inflammatory changes of the kidney or evidence of obstructing calculus appreciated on CT imaging.  Patient noted significant improvement of pain with administration of Toradol in the emergency department.  Will prescribe  nonsteroidal medication as well as antibiotics for patient's urinary tract infection and Pyridium for burning type sensation.  Patient recommended follow-up with urology outpatient for reassessment of symptoms.  Treatment plan discussed at length with patient and she acknowledged understanding was agreeable to said plan.  Patient overall well-appearing, afebrile in no acute distress. Worrisome signs and symptoms were discussed with the patient, and the patient acknowledged understanding to return to the ED if noticed. Patient was stable upon discharge.          Final Clinical Impression(s) / ED Diagnoses Final diagnoses:  Acute cystitis with hematuria    Rx / DC Orders ED Discharge Orders          Ordered    phenazopyridine (PYRIDIUM) 200 MG tablet  3 times daily        12/20/22 1821    cefpodoxime (VANTIN) 200 MG tablet  2 times daily        12/20/22 1821  meloxicam (MOBIC) 15 MG tablet  Daily PRN        12/20/22 1821              Peter Garter, [place] 12/20/22 1827    Charlynne Pander, MD 12/20/22 2051

## 2022-12-22 LAB — URINE CULTURE: Culture: NO GROWTH

## 2023-02-27 IMAGING — MG MM DIGITAL SCREENING BILAT W/ TOMO AND CAD
6 of 10 series · 6 of 30 positions shown · non-contrast
Comparison: Previous exam(s).

ACR Breast Density Category a: The breast tissue is almost entirely
fatty.

CLINICAL DATA: Screening.

EXAM:
DIGITAL SCREENING BILATERAL MAMMOGRAM WITH TOMOSYNTHESIS AND CAD
TECHNIQUE: Bilateral screening digital craniocaudal and mediolateral oblique
mammograms were obtained. Bilateral screening digital breast
tomosynthesis was performed. The images were evaluated with
computer-aided detection.

[L CC synth-2D]
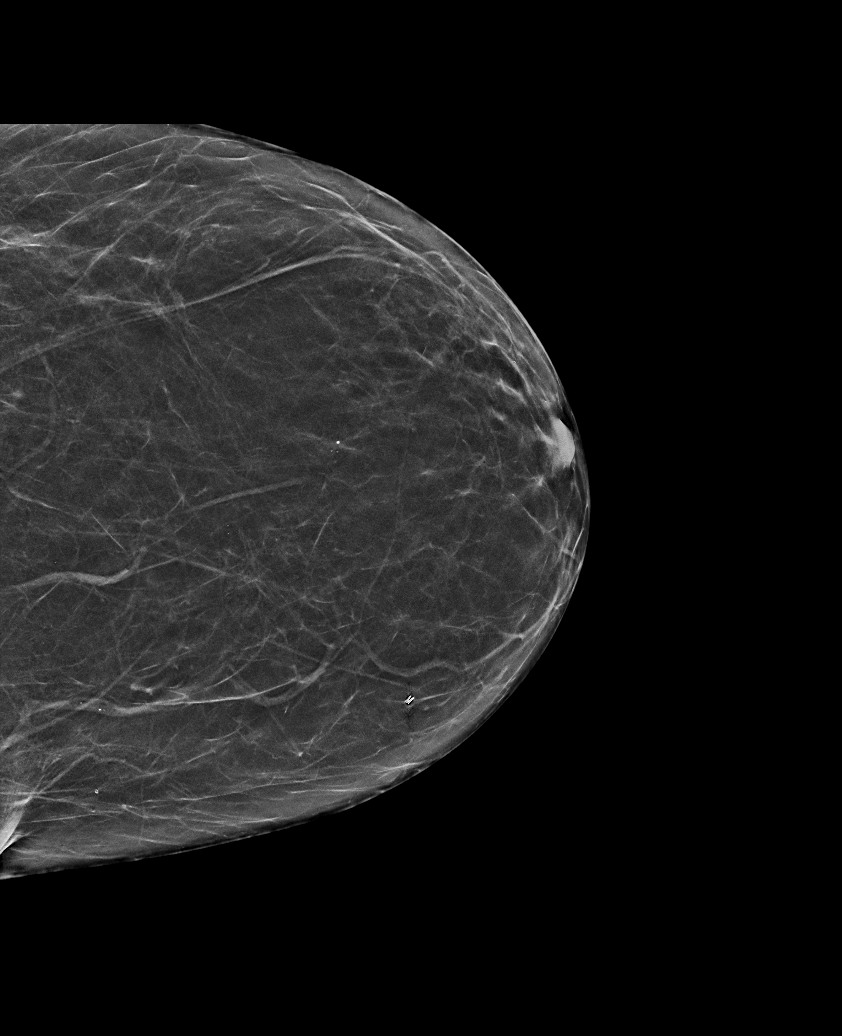

[R MLO synth-2D (1 of 2)]
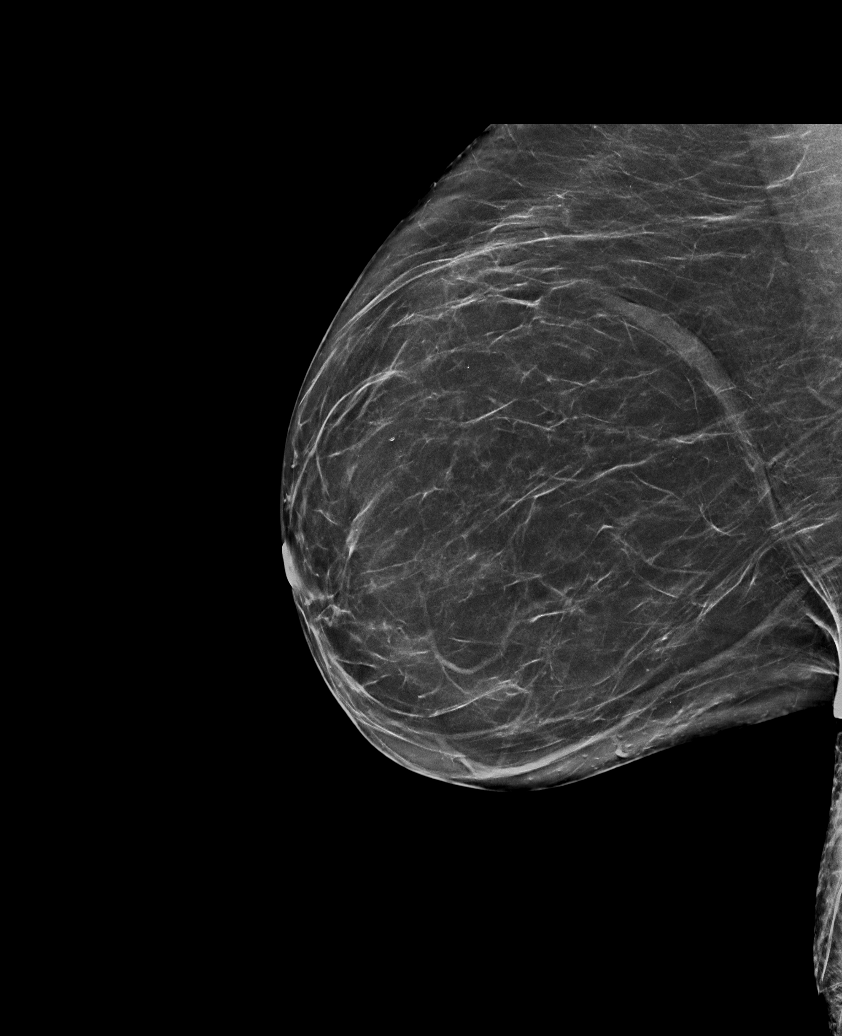

[R CC synth-2D]
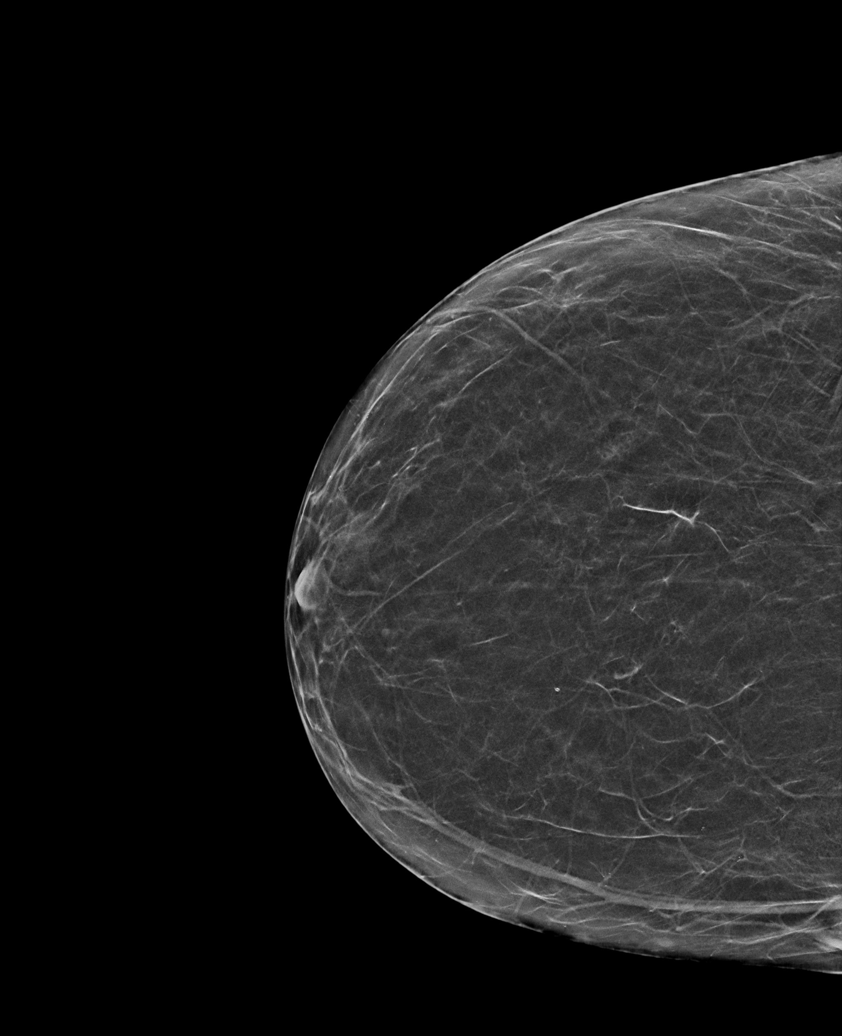

[L MLO synth-2D]
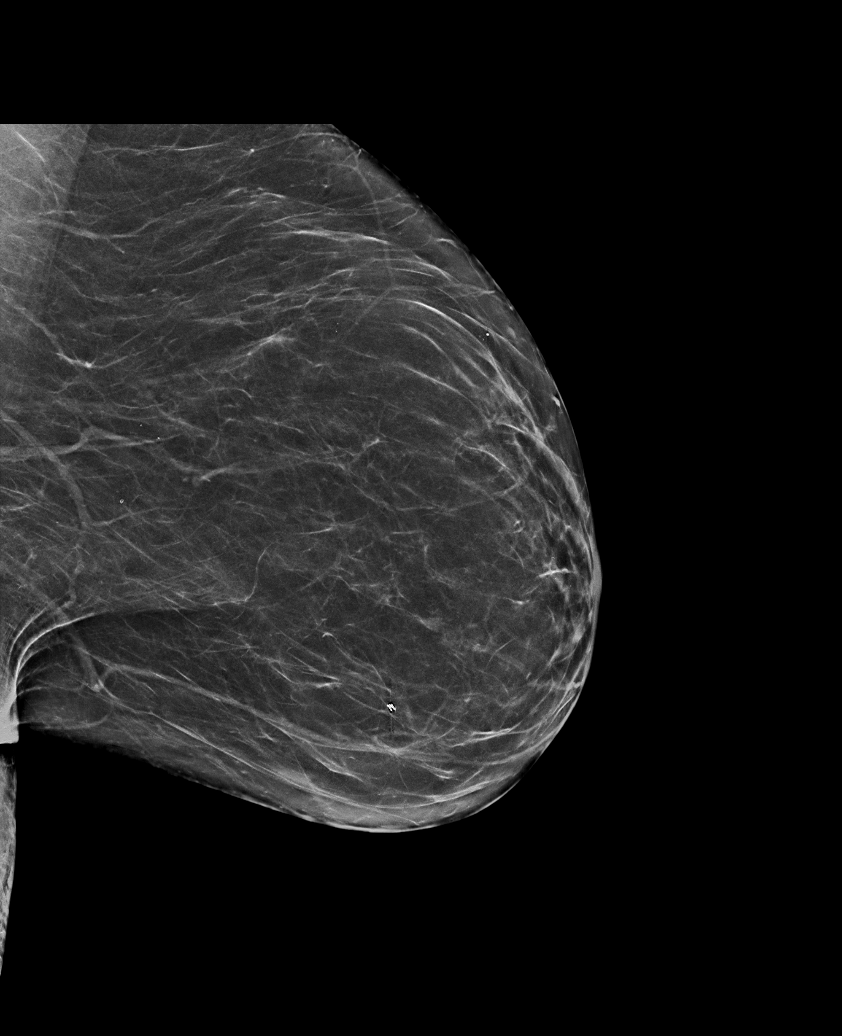

[R MLO synth-2D (2 of 2)]
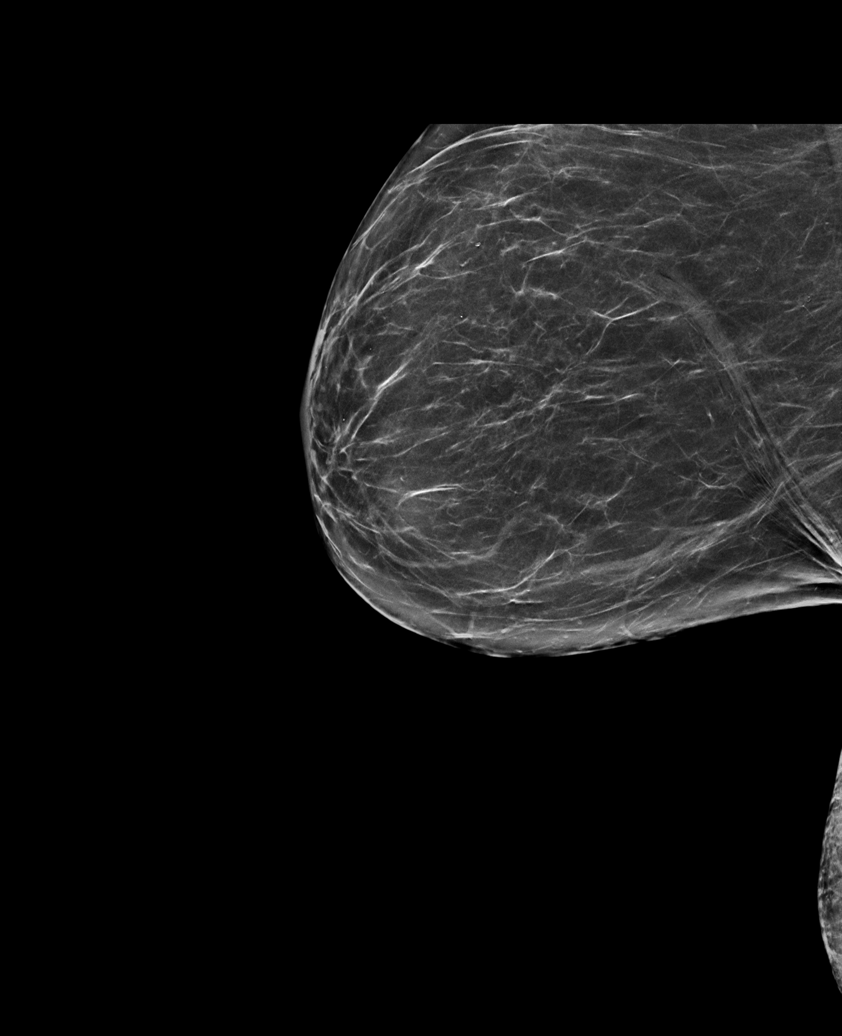

[R CC tomo · tomo slice 26/51.0]
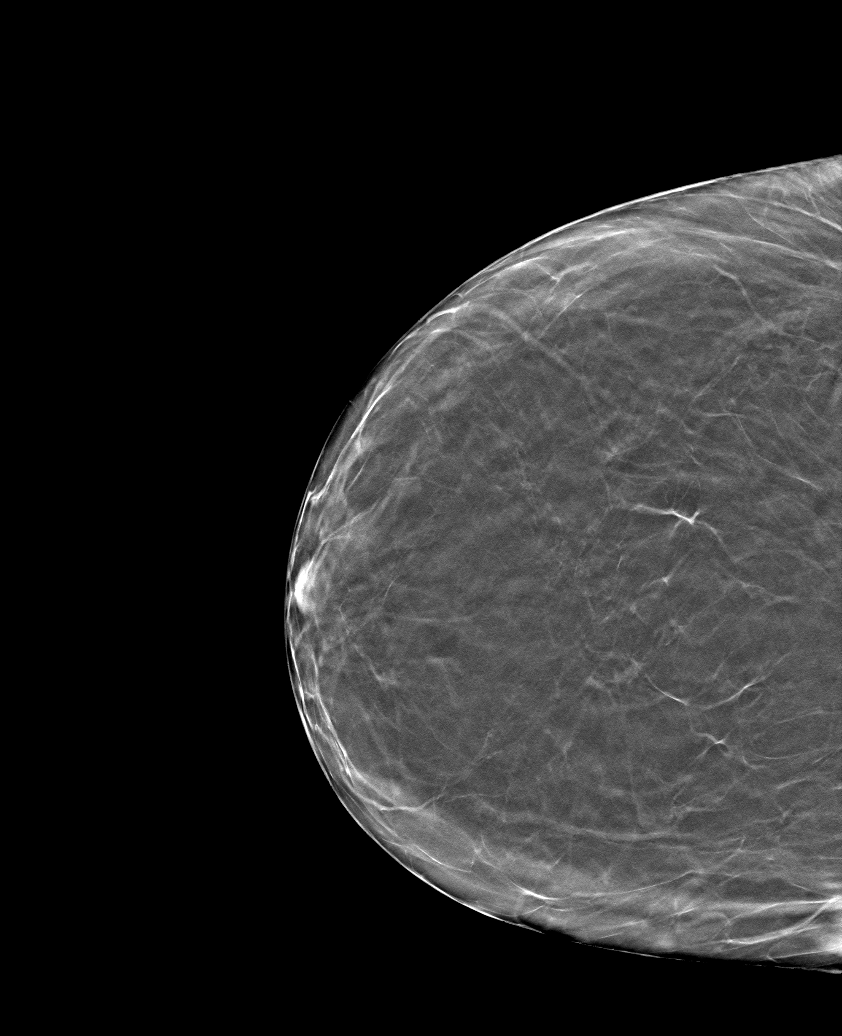

[6 of 30 positions shown; findings below may reference images not displayed]

FINDINGS: There are no findings suspicious for malignancy.
IMPRESSION: No mammographic evidence of malignancy. A result letter of this
screening mammogram will be mailed directly to the patient.

RECOMMENDATION:
Screening mammogram in one year. (Code:[id number]-[id number])

BI-RADS CATEGORY  1: Negative.

## 2023-03-10 ENCOUNTER — Encounter (HOSPITAL_COMMUNITY): Payer: Self-pay

## 2023-03-10 ENCOUNTER — Other Ambulatory Visit: Payer: Self-pay

## 2023-03-10 ENCOUNTER — Emergency Department (HOSPITAL_COMMUNITY)
Admission: EM | Admit: 2023-03-10 | Discharge: 2023-03-11 | Disposition: A | Discharge status: Home / Self Care | Payer: Medicare Other | Attending: Student | Admitting: Student

## 2023-03-10 DIAGNOSIS — N3001 Acute cystitis with hematuria: Secondary | ICD-10-CM | POA: Insufficient documentation

## 2023-03-10 DIAGNOSIS — R102 Pelvic and perineal pain: Secondary | ICD-10-CM | POA: Diagnosis present

## 2023-03-10 DIAGNOSIS — Z91040 Latex allergy status: Secondary | ICD-10-CM | POA: Diagnosis not present

## 2023-03-10 DIAGNOSIS — R944 Abnormal results of kidney function studies: Secondary | ICD-10-CM | POA: Diagnosis not present

## 2023-03-10 DIAGNOSIS — F17210 Nicotine dependence, cigarettes, uncomplicated: Secondary | ICD-10-CM | POA: Diagnosis not present

## 2023-03-10 MED ORDER — KETOROLAC TROMETHAMINE 15 MG/ML IJ SOLN
15.0000 mg | Freq: Once | INTRAMUSCULAR | Status: AC
  Administered 2023-03-10: 15 mg via INTRAVENOUS
  Filled 2023-03-10: qty 1

## 2023-03-10 NOTE — ED Provider Notes (Signed)
[place] [facility] AT [facility] Provider Note  CSN: [id number] Arrival date & time: 03/10/23 2244  Chief Complaint(s) Abdominal Pain  HPI Debbie Mclaughlin is a 67 y.o. female with PMH fibromyalgia who presents emerged part for evaluation of pelvic pain.  Patient states that for last 6 months she has had intermittent severe pelvic pain that radiates to her sacrum.  She states that the pain will become so severe that she cannot move.  She has been evaluated in the outpatient setting by urology and primary care and received a CT urogram recently that did not show any acute pathology.  Patient recently started Pyridium that minimally helped but her pain has broken through this and comes the emergency department for persistent pain.  She denies chest pain, shortness of breath, nausea, vomiting or other systemic symptoms.   Past Medical History Past Medical History:  Diagnosis Date   Cataract    OU   Chronic back pain    Fibromyalgia    Headache(784.0)    High cholesterol    Hypoglycemia    There are no problems to display for this patient.  Home Medication(s) Prior to Admission medications   Medication Sig Start Date End Date Taking? Authorizing Provider  naproxen (NAPROSYN) 375 MG tablet Take 1 tablet (375 mg total) by mouth 2 (two) times daily. 03/11/23  Yes Osceola Holian, MD  cefpodoxime (VANTIN) 200 MG tablet Take 1 tablet (200 mg total) by mouth 2 (two) times daily. 03/11/23   Rodrigo Mcgranahan, MD  HYDROcodone-acetaminophen (NORCO/VICODIN) 5-325 MG tablet Take 1 tablet by mouth every 6 (six) hours as needed. 06/20/20   Linwood Dibbles, MD  meloxicam (MOBIC) 15 MG tablet Take 1 tablet (15 mg total) by mouth daily as needed for pain. 12/20/22   Charlynne Pander, MD  Multiple Vitamin (MULTIVITAMIN) capsule Take 1 capsule by mouth daily.    [provider]  ondansetron (ZOFRAN ODT) 8 MG disintegrating tablet Take 1 tablet (8 mg total) by mouth every 8 (eight) hours  as needed for nausea or vomiting. 06/20/20   Linwood Dibbles, MD  phenazopyridine (PYRIDIUM) 200 MG tablet Take 1 tablet (200 mg total) by mouth 3 (three) times daily. 12/20/22   Charlynne Pander, MD                                                                                                                                    Past Surgical History Past Surgical History:  Procedure Laterality Date   ADENOIDECTOMY     CESAREAN SECTION     FOOT SURGERY     TONSILLECTOMY     Family History Family History  Problem Relation Age of Onset   Glaucoma Maternal Grandmother     Social History Social History   Tobacco Use   Smoking status: Every Day    Current packs/day: 1.00    Types: Cigarettes   Smokeless tobacco: Never  Substance Use Topics   Alcohol use: Yes    Comment: rarely   Drug use: No   Allergies Duloxetine hcl, Ezetimibe, Sulfasalazine, Latex, and Sulfa antibiotics  Review of Systems Review of Systems  Genitourinary:  Positive for pelvic pain and urgency.    Physical Exam Vital Signs  I have reviewed the triage vital signs BP 113/79   Pulse 66   Temp 98.5 F (36.9 C)   Resp 18   Ht 5\' 6"  (1.676 m)   Wt 95.3 kg   SpO2 99%   BMI 33.89 kg/m   Physical Exam Vitals and nursing note reviewed.  Constitutional:      General: She is not in acute distress.    Appearance: She is well-developed.  HENT:     Head: Normocephalic and atraumatic.  Eyes:     Conjunctiva/sclera: Conjunctivae normal.  Cardiovascular:     Rate and Rhythm: Normal rate and regular rhythm.     Heart sounds: No murmur heard. Pulmonary:     Effort: Pulmonary effort is normal. No respiratory distress.     Breath sounds: Normal breath sounds.  Abdominal:     Palpations: Abdomen is soft.     Tenderness: There is no abdominal tenderness.  Musculoskeletal:        General: No swelling.     Cervical back: Neck supple.  Skin:    General: Skin is warm and dry.     Capillary Refill: Capillary  refill takes less than 2 seconds.  Neurological:     Mental Status: She is alert.  Psychiatric:        Mood and Affect: Mood normal.     ED Results and Treatments Labs (all labs ordered are listed, but only abnormal results are displayed) Labs Reviewed  CBC WITH DIFFERENTIAL/PLATELET - Abnormal; Notable for the following components:      Result Value   Hemoglobin 15.7 (*)    HCT 46.4 (*)    All other components within normal limits  COMPREHENSIVE METABOLIC PANEL - Abnormal; Notable for the following components:   Glucose, Bld 125 (*)    BUN 24 (*)    Creatinine, Ser 1.22 (*)    GFR, Estimated 50 (*)    All other components within normal limits  URINALYSIS, ROUTINE W REFLEX MICROSCOPIC - Abnormal; Notable for the following components:   Color, Urine AMBER (*)    APPearance CLOUDY (*)    Hgb urine dipstick MODERATE (*)    Protein, ur 100 (*)    Nitrite POSITIVE (*)    Bacteria, UA FEW (*)    All other components within normal limits  URINE CULTURE  LIPASE, BLOOD                                                                                                                          Radiology CT ABDOMEN PELVIS W CONTRAST  Result Date: 03/11/2023 CLINICAL DATA:  Left lower quadrant pain EXAM: CT ABDOMEN AND PELVIS WITH  CONTRAST TECHNIQUE: Multidetector CT imaging of the abdomen and pelvis was performed using the standard protocol following bolus administration of intravenous contrast. RADIATION DOSE REDUCTION: This exam was performed according to the departmental dose-optimization program which includes automated exposure control, adjustment of the mA and/or kV according to patient size and/or use of iterative reconstruction technique. CONTRAST:  75mL OMNIPAQUE IOHEXOL 350 MG/ML SOLN COMPARISON:  02/18/2023 FINDINGS: Lower chest: Small hiatal hernia.  No acute abnormality. Hepatobiliary: Stable left hepatic cyst. No suspicious hepatic abnormality. Gallbladder unremarkable. Pancreas:  No focal abnormality or ductal dilatation. Spleen: No focal abnormality.  Normal size. Adrenals/Urinary Tract: No renal or adrenal mass. No stones or hydronephrosis. Urinary bladder is decompressed. Bladder wall appears thickened. Stomach/Bowel: Few scattered colonic diverticula. No active diverticulitis. Stomach and small bowel decompressed. No bowel obstruction. Normal appendix. Vascular/Lymphatic: Aortic atherosclerosis. No evidence of aneurysm or adenopathy. Reproductive: Uterus and adnexa unremarkable.  No mass. Other: No free fluid or free air. Musculoskeletal: No acute bony abnormality. IMPRESSION: Bladder is decompressed, but bladder wall appears thickened. Recommend correlation with urinalysis for possible cystitis. No hydronephrosis. Otherwise no acute findings. Electronically Signed   By: Charlett Nose M.D.   On: 03/11/2023 02:49    Pertinent labs & imaging results that were available during my care of the patient were reviewed by me and considered in my medical decision making (see MDM for details).  Medications Ordered in ED Medications  ketorolac (TORADOL) 15 MG/ML injection 15 mg (15 mg Intravenous Given 03/10/23 2351)  cefTRIAXone (ROCEPHIN) 2 g in sodium chloride 0.9 % 100 mL IVPB (0 g Intravenous Stopped 03/11/23 0147)  HYDROcodone-acetaminophen (NORCO/VICODIN) 5-325 MG per tablet 1 tablet (1 tablet Oral Given 03/11/23 0115)  lactated ringers bolus 1,000 mL (0 mLs Intravenous Stopped 03/11/23 0321)  iohexol (OMNIPAQUE) 350 MG/ML injection 75 mL (75 mLs Intravenous Contrast Given 03/11/23 0231)                                                                                                                                     Procedures Procedures  (including critical care time)  Medical Decision Making / ED Course   This patient presents to the ED for concern of pelvic pain, this involves an extensive number of treatment options, and is a complaint that carries with it a high risk of  complications and morbidity.  The differential diagnosis includes UTI, pyelonephritis, nephrolithiasis, ovarian torsion, PUD, pelvic organ prolapse  MDM: Patient seen emergency room for evaluation of pelvic pain.  Physical exam largely unremarkable, external vaginal exam with no evidence of organ prolapse or herpes labialis.  Laboratory evaluation with a BUN of 24, creatinine 1.22 which is uptrending from previous.  Urinalysis is concerning for infection with positive nitrites, 11-20 red blood cells, greater than 50 white blood cells and few bacteria but no squamous epithelial cells.  Patient is on Pyridium which certainly can affect the dipstick results, but I do  expect the microscopy to be unchanged and given that she is quite symptomatic with increased frequency, dysuria and pelvic pain, I sent a urine culture and we will treat for UTI at this time.  CT abdomen pelvis with no evidence of septic stone.  Patient given ceftriaxone and will be discharged on Vantin as this has been previously prescribed for her urinary symptoms.  She is requesting to follow-up with a new urologist as she feels she did not have a good relationship with [facility] and I sent a referral to [facility].  She will call them for follow-up.  She was given return precautions of which she voiced understanding she was discharged.   Additional history obtained:  -External records from outside source obtained and reviewed including: Chart review including previous notes, labs, imaging, consultation notes   Lab Tests: -I ordered, reviewed, and interpreted labs.   The pertinent results include:   Labs Reviewed  CBC WITH DIFFERENTIAL/PLATELET - Abnormal; Notable for the following components:      Result Value   Hemoglobin 15.7 (*)    HCT 46.4 (*)    All other components within normal limits  COMPREHENSIVE METABOLIC PANEL - Abnormal; Notable for the following components:   Glucose, Bld 125 (*)    BUN 24 (*)     Creatinine, Ser 1.22 (*)    GFR, Estimated 50 (*)    All other components within normal limits  URINALYSIS, ROUTINE W REFLEX MICROSCOPIC - Abnormal; Notable for the following components:   Color, Urine AMBER (*)    APPearance CLOUDY (*)    Hgb urine dipstick MODERATE (*)    Protein, ur 100 (*)    Nitrite POSITIVE (*)    Bacteria, UA FEW (*)    All other components within normal limits  URINE CULTURE  LIPASE, BLOOD       Imaging Studies ordered: I ordered imaging studies including CT abdomen pelvis I independently visualized and interpreted imaging. I agree with the radiologist interpretation   Medicines ordered and prescription drug management: Meds ordered this encounter  Medications   ketorolac (TORADOL) 15 MG/ML injection 15 mg   cefTRIAXone (ROCEPHIN) 2 g in sodium chloride 0.9 % 100 mL IVPB    Order Specific Question:   Antibiotic Indication:    Answer:   UTI   HYDROcodone-acetaminophen (NORCO/VICODIN) 5-325 MG per tablet 1 tablet   lactated ringers bolus 1,000 mL   iohexol (OMNIPAQUE) 350 MG/ML injection 75 mL   DISCONTD: cefpodoxime (VANTIN) 200 MG tablet    Sig: Take 1 tablet (200 mg total) by mouth 2 (two) times daily.    Dispense:  14 tablet    Refill:  0   cefpodoxime (VANTIN) 200 MG tablet    Sig: Take 1 tablet (200 mg total) by mouth 2 (two) times daily.    Dispense:  14 tablet    Refill:  0   naproxen (NAPROSYN) 375 MG tablet    Sig: Take 1 tablet (375 mg total) by mouth 2 (two) times daily.    Dispense:  20 tablet    Refill:  0    -I have reviewed the patients home medicines and have made adjustments as needed  Critical interventions none    Cardiac Monitoring: The patient was maintained on a cardiac monitor.  I personally viewed and interpreted the cardiac monitored which showed an underlying rhythm of: NSR  Social Determinants of Health:  Factors impacting patients care include: none   Reevaluation: After the interventions noted  above, I  reevaluated the patient and found that they have :improved  Co morbidities that complicate the patient evaluation  Past Medical History:  Diagnosis Date   Cataract    OU   Chronic back pain    Fibromyalgia    Headache(784.0)    High cholesterol    Hypoglycemia       Dispostion: I considered admission for this patient, but at this time she does not meet inpatient criteria for admission and she is here for discharge with outpatient follow-up     Final Clinical Impression(s) / ED Diagnoses Final diagnoses:  Acute cystitis with hematuria     @PCDICTATION @    Glendora Score, MD 03/11/23 [phone]

## 2023-03-10 NOTE — ED Triage Notes (Signed)
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 Also has blood in the urine. The pain is cramping in nature that started at 5 pm.

## 2023-03-11 ENCOUNTER — Emergency Department (HOSPITAL_COMMUNITY): Payer: Medicare Other

## 2023-03-11 LAB — CBC WITH DIFFERENTIAL/PLATELET
Abs Immature Granulocytes: 0.02 10*3/uL (ref 0.00–0.07)
Basophils Absolute: 0 10*3/uL (ref 0.0–0.1)
Basophils Relative: 0 %
Eosinophils Absolute: 0.1 10*3/uL (ref 0.0–0.5)
Eosinophils Relative: 1 %
HCT: 46.4 % — ABNORMAL HIGH (ref 36.0–46.0)
Hemoglobin: 15.7 g/dL — ABNORMAL HIGH (ref 12.0–15.0)
Immature Granulocytes: 0 %
Lymphocytes Relative: 19 %
Lymphs Abs: 1.3 10*3/uL (ref 0.7–4.0)
MCH: 31 pg (ref 26.0–34.0)
MCHC: 33.8 g/dL (ref 30.0–36.0)
MCV: 91.7 fL (ref 80.0–100.0)
Monocytes Absolute: 0.6 10*3/uL (ref 0.1–1.0)
Monocytes Relative: 8 %
Neutro Abs: 5.1 10*3/uL (ref 1.7–7.7)
Neutrophils Relative %: 72 %
Platelets: 229 10*3/uL (ref 150–400)
RBC: 5.06 MIL/uL (ref 3.87–5.11)
RDW: 13 % (ref 11.5–15.5)
WBC: 7.1 10*3/uL (ref 4.0–10.5)
nRBC: 0 % (ref 0.0–0.2)

## 2023-03-11 LAB — COMPREHENSIVE METABOLIC PANEL
ALT: 14 U/L (ref 0–44)
AST: 22 U/L (ref 15–41)
Albumin: 3.8 g/dL (ref 3.5–5.0)
Alkaline Phosphatase: 96 U/L (ref 38–126)
Anion gap: 14 (ref 5–15)
BUN: 24 mg/dL — ABNORMAL HIGH (ref 8–23)
CO2: 25 mmol/L (ref 22–32)
Calcium: 9.9 mg/dL (ref 8.9–10.3)
Chloride: 100 mmol/L (ref 98–111)
Creatinine, Ser: 1.22 mg/dL — ABNORMAL HIGH (ref 0.44–1.00)
GFR, Estimated: 50 mL/min — ABNORMAL LOW (ref >60)
Glucose, Bld: 125 mg/dL — ABNORMAL HIGH (ref 70–99)
Potassium: 4.1 mmol/L (ref 3.5–5.1)
Sodium: 139 mmol/L (ref 135–145)
Total Bilirubin: 0.8 mg/dL (ref 0.3–1.2)
Total Protein: 7.1 g/dL (ref 6.5–8.1)

## 2023-03-11 LAB — URINALYSIS, ROUTINE W REFLEX MICROSCOPIC
Bilirubin Urine: NEGATIVE
Glucose, UA: NEGATIVE mg/dL
Ketones, ur: NEGATIVE mg/dL
Leukocytes,Ua: NEGATIVE
Nitrite: POSITIVE — AB
Protein, ur: 100 mg/dL — AB
Specific Gravity, Urine: 1.02 (ref 1.005–1.030)
WBC, UA: >50 WBC/hpf (ref 0–5)
pH: 5 (ref 5.0–8.0)

## 2023-03-11 LAB — LIPASE, BLOOD: Lipase: 30 U/L (ref 11–51)

## 2023-03-11 MED ORDER — CEFPODOXIME PROXETIL 200 MG PO TABS: 200.0000 mg | ORAL_TABLET | Freq: Two times a day (BID) | ORAL | 0 refills | Status: AC

## 2023-03-11 MED ORDER — LACTATED RINGERS IV BOLUS
1000.0000 mL | Freq: Once | INTRAVENOUS | Status: AC
  Administered 2023-03-11: 1000 mL via INTRAVENOUS

## 2023-03-11 MED ORDER — SODIUM CHLORIDE 0.9 % IV SOLN
2.0000 g | Freq: Once | INTRAVENOUS | Status: AC
  Administered 2023-03-11: 2 g via INTRAVENOUS
  Filled 2023-03-11: qty 20

## 2023-03-11 MED ORDER — IOHEXOL 350 MG/ML SOLN
75.0000 mL | Freq: Once | INTRAVENOUS | Status: AC | PRN
  Administered 2023-03-11: 75 mL via INTRAVENOUS

## 2023-03-11 MED ORDER — CEFPODOXIME PROXETIL 200 MG PO TABS: 200.0000 mg | ORAL_TABLET | Freq: Two times a day (BID) | ORAL | 0 refills | Status: DC

## 2023-03-11 MED ORDER — HYDROCODONE-ACETAMINOPHEN 5-325 MG PO TABS
1.0000 | ORAL_TABLET | Freq: Once | ORAL | Status: AC
  Administered 2023-03-11: 1 via ORAL
  Filled 2023-03-11: qty 1

## 2023-03-11 MED ORDER — NAPROXEN 375 MG PO TABS: 375.0000 mg | ORAL_TABLET | Freq: Two times a day (BID) | ORAL | 0 refills | Status: AC

## 2023-03-11 NOTE — ED Notes (Signed)
Discharge instructions discussed with pt. Verbalized understanding. VSS. No questions or concerns regarding discharge  

## 2023-03-12 LAB — URINE CULTURE
